# Patient Record
Sex: Female | Born: 1960 | Race: White | Hispanic: No | Marital: Single | Smoking: Never smoker
Health system: Southern US, Community
[De-identification: ages and names within clinical notes are randomized; demographics above are authoritative.]

## PROBLEM LIST (undated history)

## (undated) DIAGNOSIS — N390 Urinary tract infection, site not specified: Secondary | ICD-10-CM

## (undated) DIAGNOSIS — H409 Unspecified glaucoma: Secondary | ICD-10-CM

## (undated) DIAGNOSIS — N200 Calculus of kidney: Secondary | ICD-10-CM

## (undated) DIAGNOSIS — Z967 Presence of other bone and tendon implants: Secondary | ICD-10-CM

## (undated) HISTORY — DX: Unspecified glaucoma: H40.9

## (undated) HISTORY — PX: OTHER SURGICAL HISTORY: SHX169

## (undated) HISTORY — PX: DILATION AND CURETTAGE OF UTERUS: SHX78

## (undated) HISTORY — DX: Calculus of kidney: N20.0

## (undated) HISTORY — PX: WISDOM TOOTH EXTRACTION: SHX21

## (undated) HISTORY — PX: TONSILLECTOMY: SHX5217

## (undated) HISTORY — DX: Urinary tract infection, site not specified: N39.0

## (undated) HISTORY — PX: TONSILLECTOMY: SUR1361

## (undated) HISTORY — DX: Presence of other bone and tendon implants: Z96.7

---

## 2009-10-23 ENCOUNTER — Ambulatory Visit: Payer: Self-pay | Admitting: Family Medicine

## 2010-10-31 ENCOUNTER — Ambulatory Visit: Payer: Self-pay | Admitting: Internal Medicine

## 2012-07-08 ENCOUNTER — Ambulatory Visit: Payer: Self-pay | Admitting: Family Medicine

## 2012-07-08 LAB — CBC WITH DIFFERENTIAL/PLATELET
Basophil #: 0 10*3/uL (ref 0.0–0.1)
Basophil %: 0.4 %
Eosinophil #: 0.1 10*3/uL (ref 0.0–0.7)
Eosinophil %: 2 %
HGB: 13.5 g/dL (ref 12.0–16.0)
Lymphocyte %: 39.1 %
MCHC: 34 g/dL (ref 32.0–36.0)
Monocyte #: 0.5 x10 3/mm (ref 0.2–0.9)
Neutrophil %: 50.8 %
Platelet: 184 10*3/uL (ref 150–440)
RBC: 4.25 10*6/uL (ref 3.80–5.20)
WBC: 6.6 10*3/uL (ref 3.6–11.0)

## 2012-07-08 LAB — COMPREHENSIVE METABOLIC PANEL
Albumin: 3.8 g/dL (ref 3.4–5.0)
Anion Gap: 10 (ref 7–16)
BUN: 11 mg/dL (ref 7–18)
Chloride: 104 mmol/L (ref 98–107)
Co2: 24 mmol/L (ref 21–32)
EGFR (African American): >60
EGFR (Non-African Amer.): >60
Potassium: 3.9 mmol/L (ref 3.5–5.1)
SGOT(AST): 15 U/L (ref 15–37)
SGPT (ALT): 21 U/L (ref 12–78)
Total Protein: 7.5 g/dL (ref 6.4–8.2)

## 2012-12-25 ENCOUNTER — Ambulatory Visit: Payer: Self-pay | Admitting: Internal Medicine

## 2013-06-28 ENCOUNTER — Ambulatory Visit: Payer: Self-pay | Admitting: Internal Medicine

## 2013-07-12 ENCOUNTER — Ambulatory Visit: Payer: Self-pay | Admitting: Gastroenterology

## 2014-12-01 ENCOUNTER — Ambulatory Visit: Payer: Self-pay | Admitting: Physician Assistant

## 2015-01-21 ENCOUNTER — Ambulatory Visit: Payer: Self-pay | Admitting: Internal Medicine

## 2015-06-03 ENCOUNTER — Ambulatory Visit
Admission: EM | Admit: 2015-06-03 | Discharge: 2015-06-03 | Disposition: A | Discharge status: Home / Self Care | Payer: BLUE CROSS/BLUE SHIELD | Attending: Family Medicine | Admitting: Family Medicine

## 2015-06-03 DIAGNOSIS — L299 Pruritus, unspecified: Secondary | ICD-10-CM

## 2015-06-03 DIAGNOSIS — B369 Superficial mycosis, unspecified: Secondary | ICD-10-CM | POA: Insufficient documentation

## 2015-06-03 DIAGNOSIS — R21 Rash and other nonspecific skin eruption: Secondary | ICD-10-CM | POA: Diagnosis present

## 2015-06-03 LAB — GLUCOSE, CAPILLARY: Glucose-Capillary: 92 mg/dL (ref 65–99)

## 2015-06-03 MED ORDER — RANITIDINE HCL 150 MG PO CAPS: 150.0000 mg | ORAL_CAPSULE | Freq: Two times a day (BID) | ORAL | Status: DC

## 2015-06-03 MED ORDER — KETOCONAZOLE 2 % EX CREA: 1.0000 "application " | TOPICAL_CREAM | Freq: Two times a day (BID) | CUTANEOUS | Status: DC

## 2015-06-03 MED ORDER — LORATADINE 10 MG PO TABS: 10.0000 mg | ORAL_TABLET | Freq: Every day | ORAL | Status: DC

## 2015-06-03 NOTE — ED Provider Notes (Signed)
CSN: [id number]     Arrival date & time 06/03/15  1516 History   First MD Initiated Contact with Patient 06/03/15 1609     Chief Complaint  Patient presents with  . Rash   (Consider location/radiation/quality/duration/timing/severity/associated sxs/prior Treatment) Patient is a 54 y.o. female presenting with rash. No language interpreter was used.  Rash Location: R breast and , some chest, and neck. Quality: itchiness   Quality: not blistering, not bruising, not dry and not peeling   Severity:  Moderate Onset quality:  Sudden Duration:  5 days (patient is unable to say exactly when ) Progression:  Spreading Chronicity:  New Context: eggs   Context: not animal contact, not chemical exposure and not diapers   Relieved by:  Nothing Worsened by:  Nothing tried Ineffective treatments:  Antihistamines Associated symptoms: no abdominal pain, no diarrhea, no fatigue and no fever     History reviewed. No pertinent past medical history. Past Surgical History  Procedure Laterality Date  . Tonsillectomy     History reviewed. No pertinent family history. History  Substance Use Topics  . Smoking status: Never Smoker   . Smokeless tobacco: Not on file  . Alcohol Use: 0.0 oz/week    0 Standard drinks or equivalent per week     Comment: rarely   OB History    No data available     Review of Systems  Constitutional: Negative for fever and fatigue.  Gastrointestinal: Negative for abdominal pain and diarrhea.  Skin: Positive for rash.    Allergies  Penicillins  Home Medications   Prior to Admission medications   Medication Sig Start Date End Date Taking? Authorizing Provider  Biotin 5 MG TABS Take by mouth.   Yes Historical Provider, MD  calcium carbonate (OS-CAL) 600 MG TABS tablet Take 600 mg by mouth 2 (two) times daily with a meal.   Yes Historical Provider, MD  Multiple Vitamin (MULTIVITAMIN) tablet Take 1 tablet by mouth daily.   Yes Historical Provider, MD  vitamin B-12  (CYANOCOBALAMIN) 1000 MCG tablet Take 1,000 mcg by mouth daily.   Yes Historical Provider, MD  ketoconazole (NIZORAL) 2 % cream Apply 1 application topically 2 (two) times daily. 06/03/15   Hassan Rowan, MD  loratadine (CLARITIN) 10 MG tablet Take 1 tablet (10 mg total) by mouth daily. Take 1 tablet in the morning. As needed for itching. 06/03/15   Hassan Rowan, MD  ranitidine (ZANTAC) 150 MG capsule Take 1 capsule (150 mg total) by mouth 2 (two) times daily. 06/03/15   Hassan Rowan, MD   BP 139/100 mmHg  Pulse 69  Temp(Src) 98 F (36.7 C) (Oral)  Resp 16  Ht 5\' 5"  (1.651 m)  Wt 187 lb (84.823 kg)  BMI 31.12 kg/m2  SpO2 100%  LMP 05/20/2015 (Approximate) Physical Exam  Constitutional: She is oriented to person, place, and time. She appears well-developed and well-nourished. No distress.  HENT:  Head: Normocephalic and atraumatic.  Neck: Neck supple.  Neurological: She is alert and oriented to person, place, and time.  Skin: Rash noted. There is cyanosis and erythema.     Psychiatric: She has a normal mood and affect.  Vitals reviewed.  Rash heaviest under the L breast ED Course  Procedures (including critical care time) Labs Review Labs Reviewed  GLUCOSE, CAPILLARY    Imaging Review No results found. Results for orders placed or performed during the hospital encounter of 06/03/15  Glucose, capillary  Result Value Ref Range   Glucose-Capillary 92 65 -  99 mg/dL    Patient rash consistent w/fungal rash. BS checked to make sure diabetes was not being missed.  Will treat w/antifungal cream and antihistamines.  MDM   1. Fungal dermatitis   2. Rash, skin   3. Itching       Hassan Rowan, MD 06/04/15 [phone]

## 2015-06-03 NOTE — ED Notes (Signed)
Patient states that she has a rash under left breast. She states that her entire chest is itchy. She states that symptoms started 1 week ago, worsening over last 3 days. She states that she has tried benadryl and witch hazel.

## 2015-06-03 NOTE — Discharge Instructions (Signed)
Cutaneous Candidiasis °Cutaneous candidiasis is a condition in which there is an overgrowth of yeast (candida) on the skin. Yeast normally live on the skin, but in small enough numbers not to cause any symptoms. In certain cases, increased growth of the yeast may cause an actual yeast infection. This kind of infection usually occurs in areas of the skin that are constantly warm and moist, such as the armpits or the groin. Yeast is the most common cause of diaper rash in babies and in people who cannot control their bowel movements (incontinence). °CAUSES  °The fungus that most often causes cutaneous candidiasis is Candida albicans. Conditions that can increase the risk of getting a yeast infection of the skin include: °· Obesity. °· Pregnancy. °· Diabetes. °· Taking antibiotic medicine. °· Taking birth control pills. °· Taking steroid medicines. °· Thyroid disease. °· An iron or zinc deficiency. °· Problems with the immune system. °SYMPTOMS  °· Red, swollen area of the skin. °· Bumps on the skin. °· Itchiness. °DIAGNOSIS  °The diagnosis of cutaneous candidiasis is usually based on its appearance. Light scrapings of the skin may also be taken and viewed under a microscope to identify the presence of yeast. °TREATMENT  °Antifungal creams may be applied to the infected skin. In severe cases, oral medicines may be needed.  °HOME CARE INSTRUCTIONS  °· Keep your skin clean and dry. °· Maintain a healthy weight. °· If you have diabetes, keep your blood sugar under control. °SEEK IMMEDIATE MEDICAL CARE IF: °· Your rash continues to spread despite treatment. °· You have a fever, chills, or abdominal pain. °Document Released: 07/05/2011 Document Revised: 01/09/2012 Document Reviewed: 07/05/2011 °[employer]® Patient Information ©2015 [employer]. This information is not intended to replace advice given to you by your health care provider. Make sure you discuss any questions you have with your health care provider. ° °Rash °A  rash is a change in the color or feel of your skin. There are many different types of rashes. You may have other problems along with your rash. °HOME CARE °· Avoid the thing that caused your rash. °· Do not scratch your rash. °· You may take cools baths to help stop itching. °· Only take medicines as told by your doctor. °· Keep all doctor visits as told. °GET HELP RIGHT AWAY IF:  °· Your pain, puffiness (swelling), or redness gets worse. °· You have a fever. °· You have new or severe problems. °· You have body aches, watery poop (diarrhea), or you throw up (vomit). °· Your rash is not better after 3 days. °MAKE SURE YOU:  °· Understand these instructions. °· Will watch your condition. °· Will get help right away if you are not doing well or get worse. °Document Released: 04/04/2008 Document Revised: 01/09/2012 Document Reviewed: 08/01/2011 °[employer]® Patient Information ©2015 [employer]. This information is not intended to replace advice given to you by your health care provider. Make sure you discuss any questions you have with your health care provider. ° °

## 2015-09-10 ENCOUNTER — Other Ambulatory Visit: Payer: Self-pay | Admitting: Internal Medicine

## 2015-09-10 DIAGNOSIS — R1030 Lower abdominal pain, unspecified: Secondary | ICD-10-CM

## 2015-09-10 DIAGNOSIS — N898 Other specified noninflammatory disorders of vagina: Secondary | ICD-10-CM

## 2015-09-10 DIAGNOSIS — R140 Abdominal distension (gaseous): Secondary | ICD-10-CM

## 2015-09-14 ENCOUNTER — Ambulatory Visit
Admission: RE | Admit: 2015-09-14 | Discharge: 2015-09-14 | Disposition: A | Discharge status: Home / Self Care | Payer: BLUE CROSS/BLUE SHIELD | Source: Ambulatory Visit | Attending: Internal Medicine | Admitting: Internal Medicine

## 2015-09-14 DIAGNOSIS — R1030 Lower abdominal pain, unspecified: Secondary | ICD-10-CM | POA: Diagnosis present

## 2015-09-14 DIAGNOSIS — N898 Other specified noninflammatory disorders of vagina: Secondary | ICD-10-CM | POA: Insufficient documentation

## 2015-09-14 DIAGNOSIS — N83201 Unspecified ovarian cyst, right side: Secondary | ICD-10-CM | POA: Diagnosis not present

## 2015-09-14 DIAGNOSIS — R140 Abdominal distension (gaseous): Secondary | ICD-10-CM

## 2015-10-06 ENCOUNTER — Encounter: Payer: Self-pay | Admitting: Certified Nurse Midwife

## 2016-06-29 IMAGING — US US TRANSVAGINAL NON-OB
1 series · 13 of 25 positions shown · non-contrast
Comparison: Pelvic ultrasound December 25, 2012 and abdominal
and pelvic CT scan June 28, 2013.

CLINICAL DATA: Vaginal discharge, abdominal and pelvic abdominal
pain, symptoms since December 2014. The patient's last normal
menstrual period began today.

EXAM:
TRANSABDOMINAL AND TRANSVAGINAL ULTRASOUND OF PELVIS
TECHNIQUE: Both transabdominal and transvaginal ultrasound examinations of the
pelvis were performed. Transabdominal technique was performed for
global imaging of the pelvis including uterus, ovaries, adnexal
regions, and pelvic cul-de-sac. It was necessary to proceed with
endovaginal exam following the transabdominal exam to visualize the
endometrium and ovaries.

[Series 1: us transvaginal non-ob · 0.24mm/px · 13 of 86 slices shown]
[im 1/86]
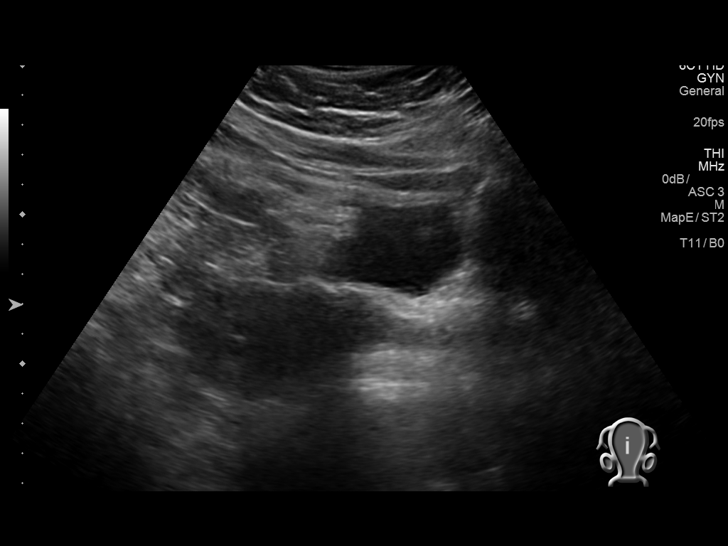
[im 8/86]
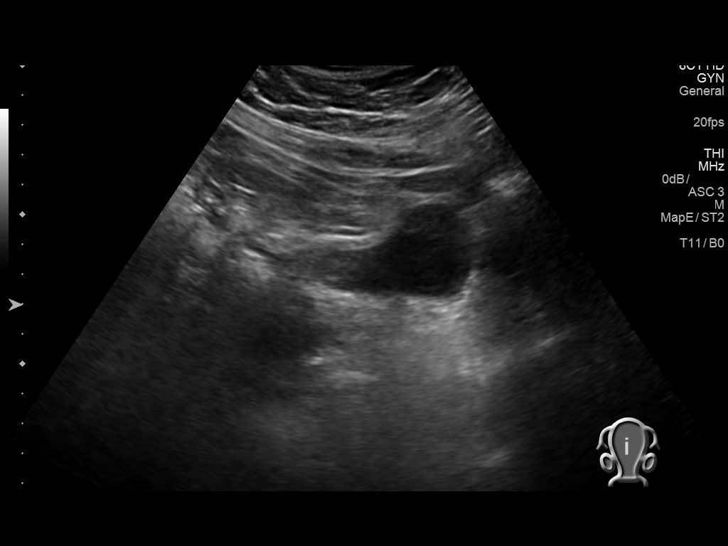
[im 15/86]
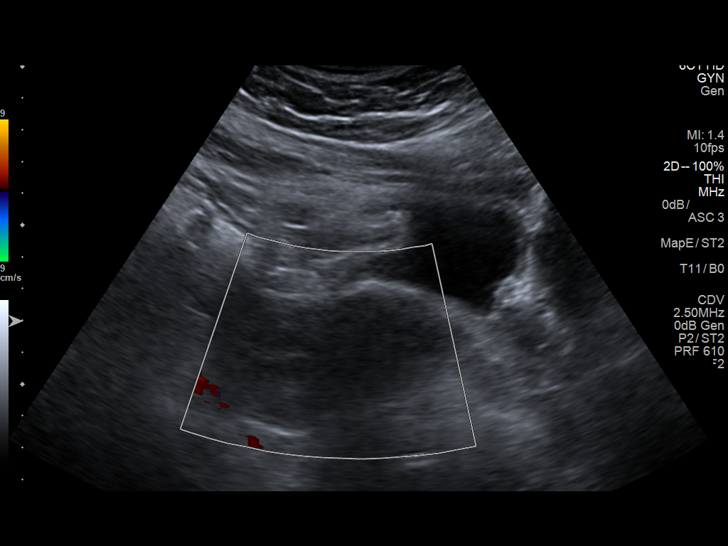
[im 22/86]
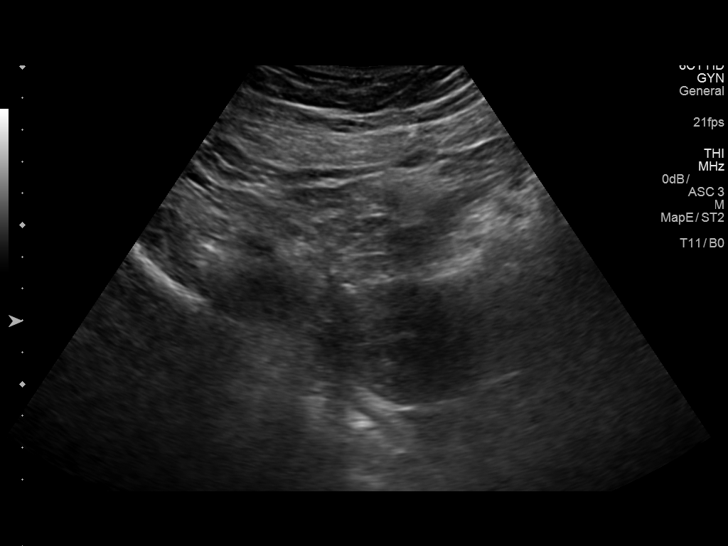
[im 29/86]
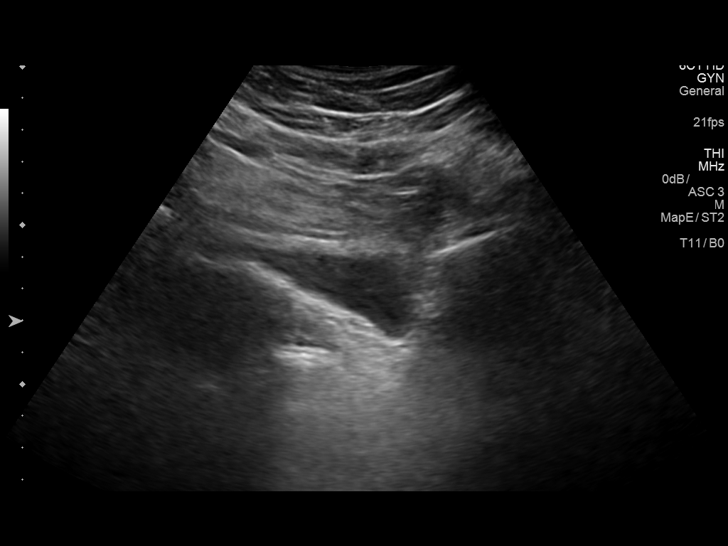
[im 36/86]
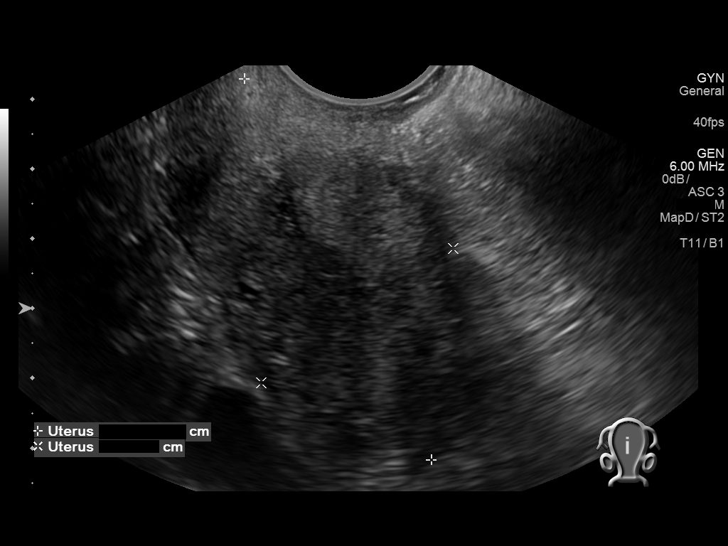
[im 43/86]
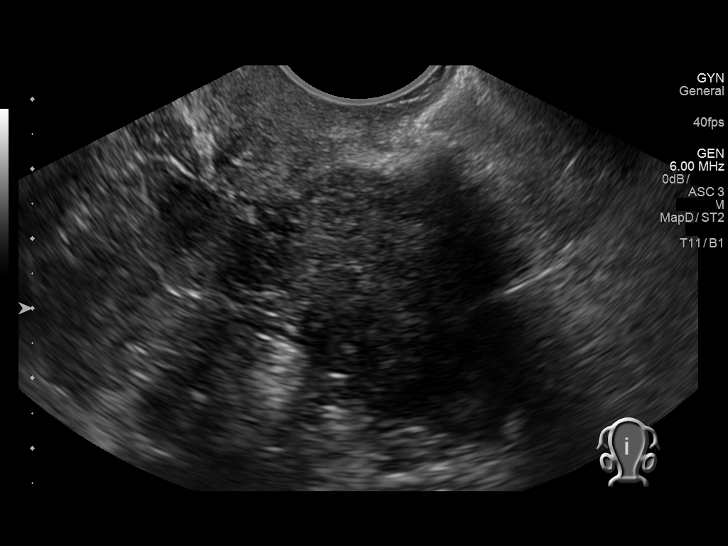
[im 50/86]
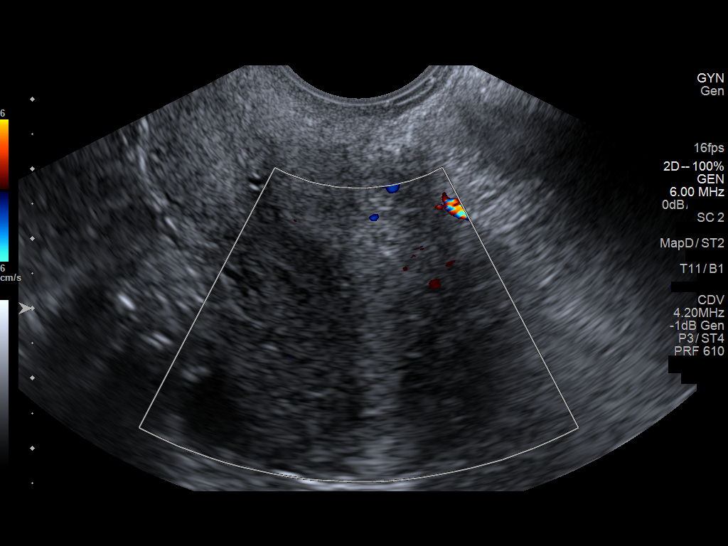
[im 57/86]
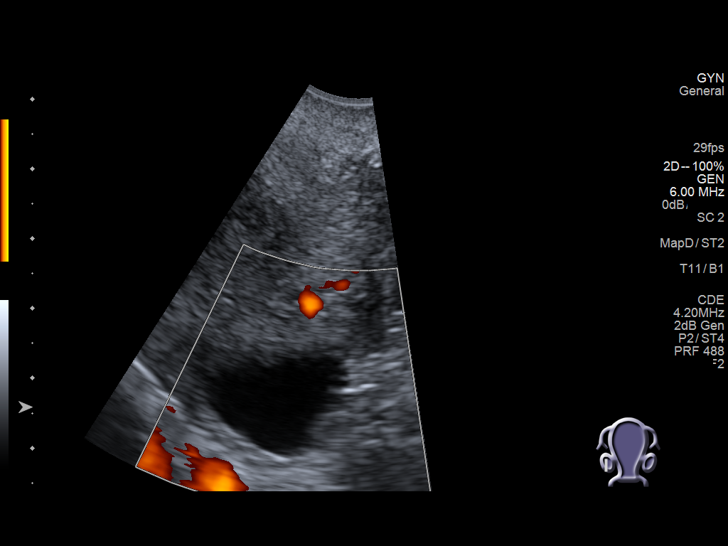
[im 64/86]
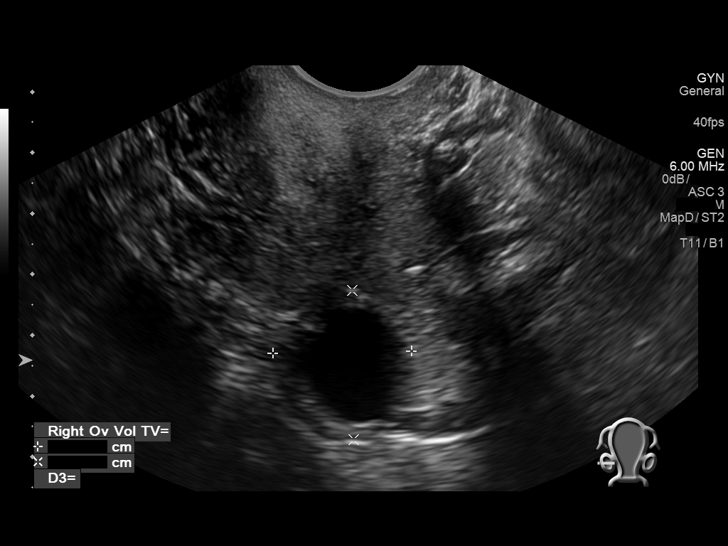
[im 71/86]
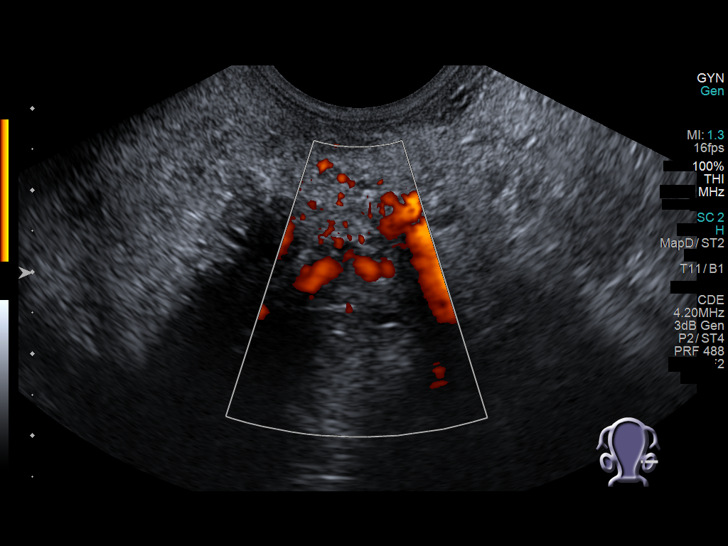
[im 78/86]
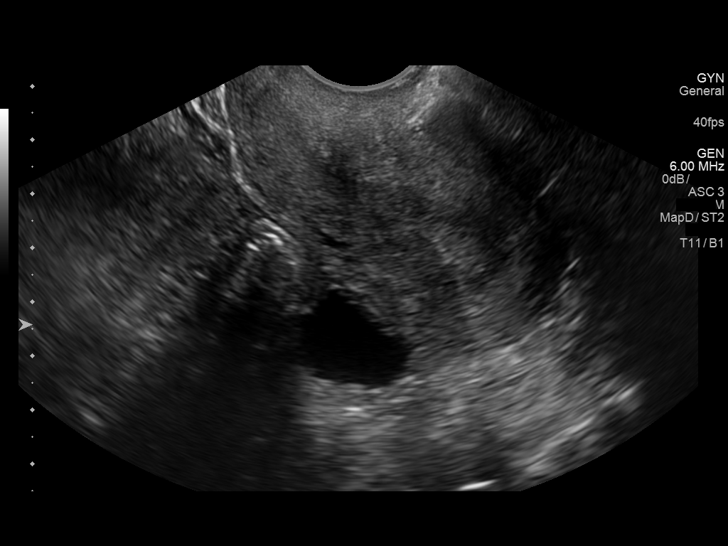
[im 86/86]
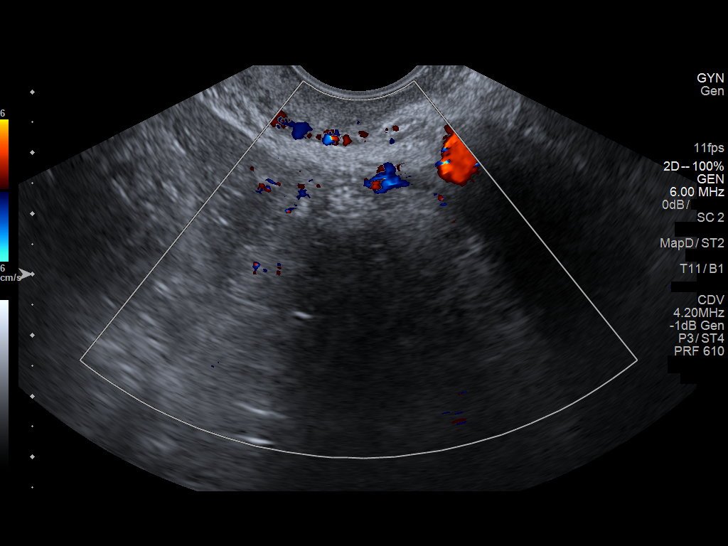

[13 of 25 positions shown; findings below may reference images not displayed]

FINDINGS: Uterus

Measurements: 7.1 x 3.8 x 5.9 cm.. The uterus is retroflexed. The
myometrial echotexture is normal.

Endometrium

Thickness: 5.9 cm.  No focal abnormality visualized.

Right ovary

Measurements: 2.5 x 2.3 x 2.5 cm. There is a simple appearing cystic
structure measuring 2.1 x 1.8 x 1.9 cm.

Left ovary

Measurements: 1.3 x 0.8 x 0.9 cm. Normal appearance/no adnexal mass.

Other findings

There is no free pelvic fluid.
IMPRESSION: 1. There is no evidence of pelvic masses or free fluid or other
evidence of pelvic inflammatory disease.
2. The uterus and ovaries exhibit no acute abnormalities. A simple
appearing right ovarian cyst measures 2.1 cm in greatest dimension.

## 2016-07-21 ENCOUNTER — Other Ambulatory Visit: Payer: Self-pay | Admitting: Internal Medicine

## 2016-07-21 DIAGNOSIS — M79605 Pain in left leg: Secondary | ICD-10-CM

## 2016-07-26 ENCOUNTER — Ambulatory Visit: Admission: RE | Admit: 2016-07-26 | Payer: BLUE CROSS/BLUE SHIELD | Source: Ambulatory Visit

## 2016-09-13 IMAGING — CR RIGHT ELBOW - COMPLETE 3+ VIEW
7 series · 7 of 7 positions shown · non-contrast
Comparison: None.

CLINICAL DATA: Initial encounter for pain after fall.

EXAM:
RIGHT ELBOW - COMPLETE 3+ VIEW

[elbow lat]
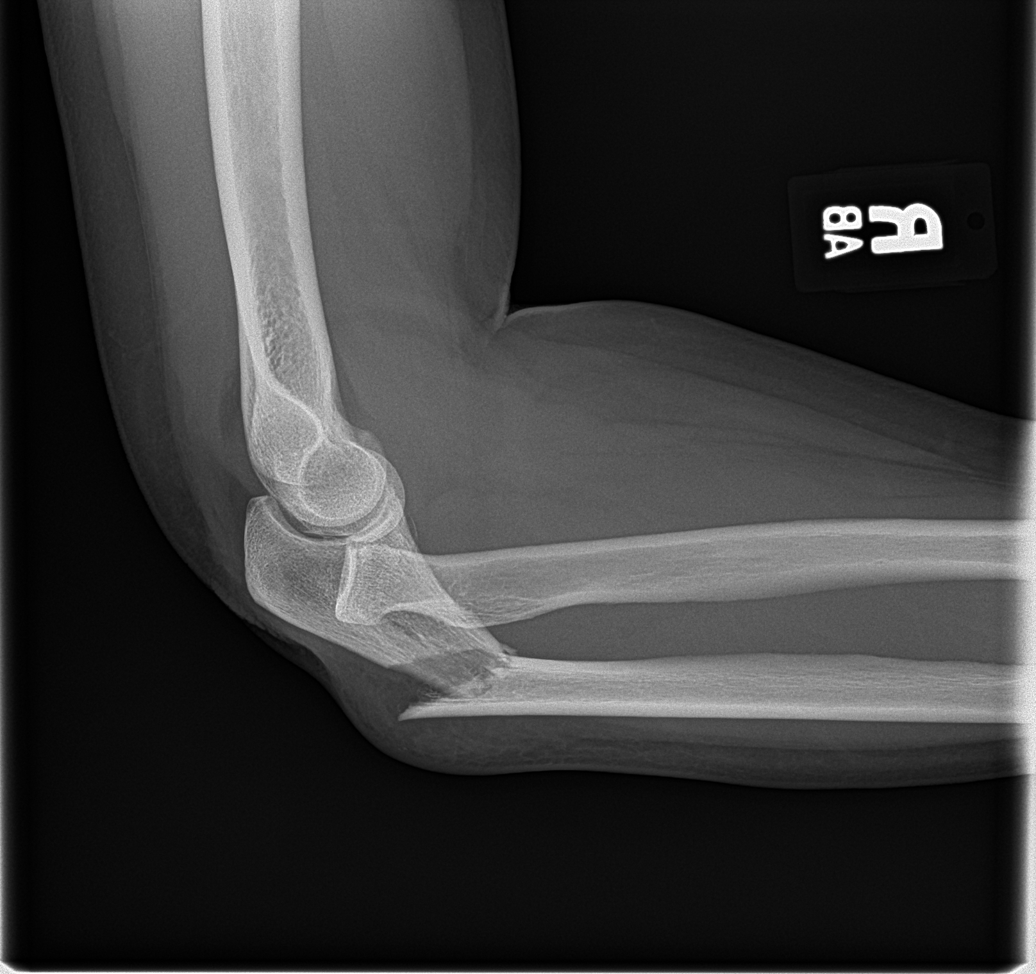

[elbow ap (1 of 3)]
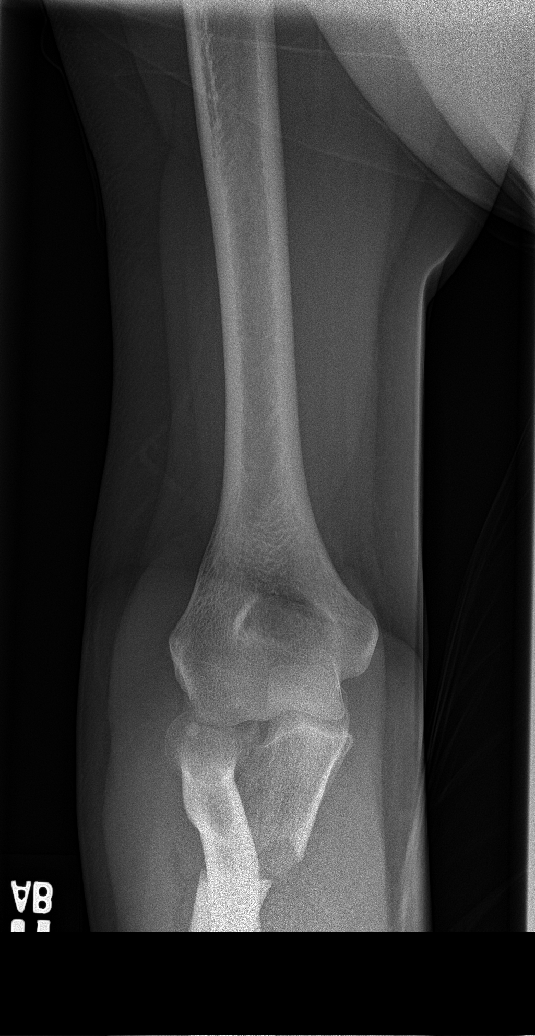

[elbow ap (2 of 3)]
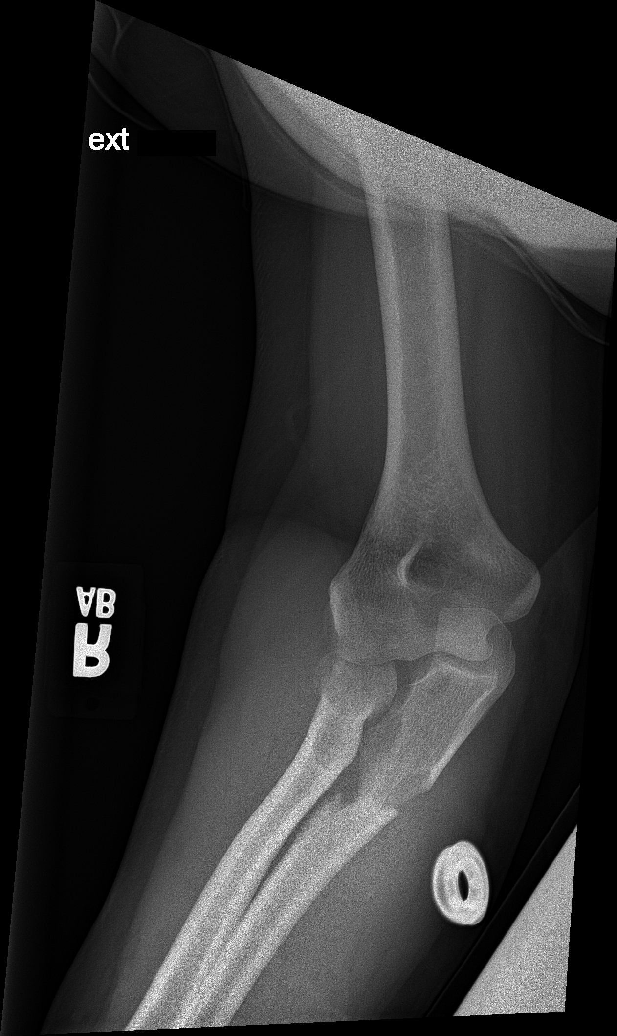

[elbow obl (1 of 3)]
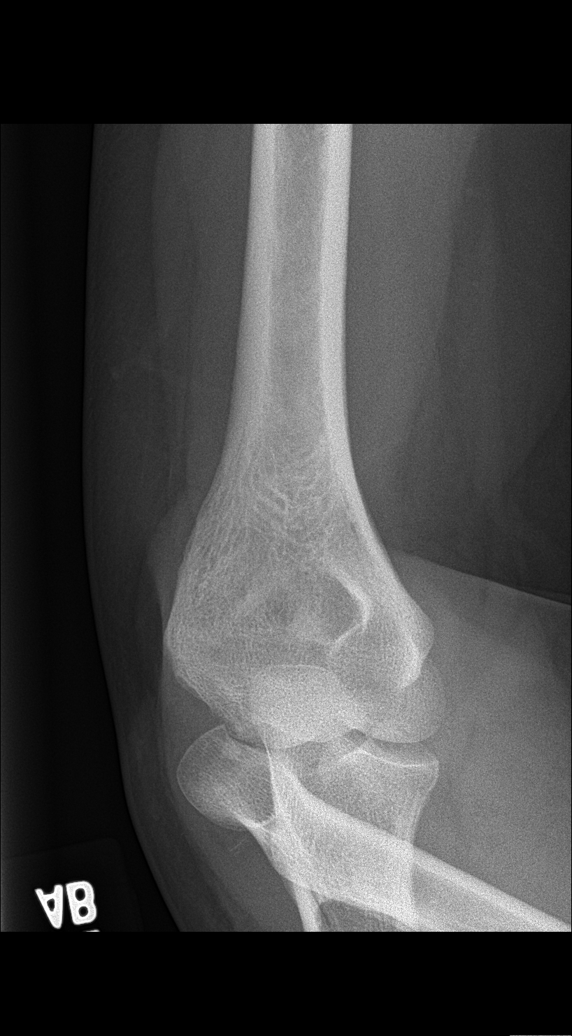

[elbow obl (2 of 3)]
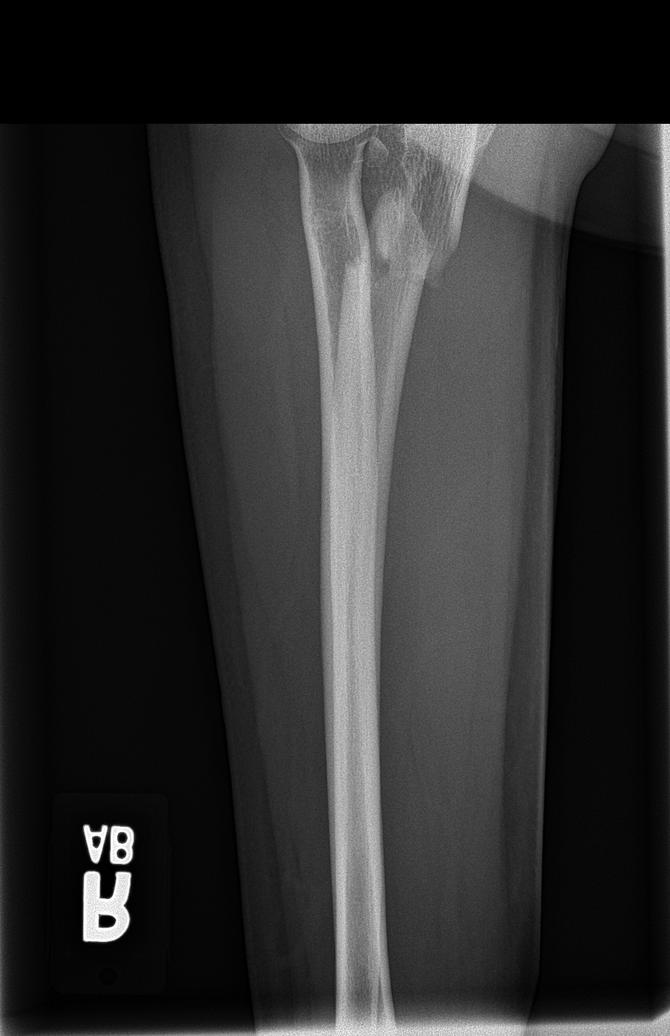

[elbow obl (3 of 3)]
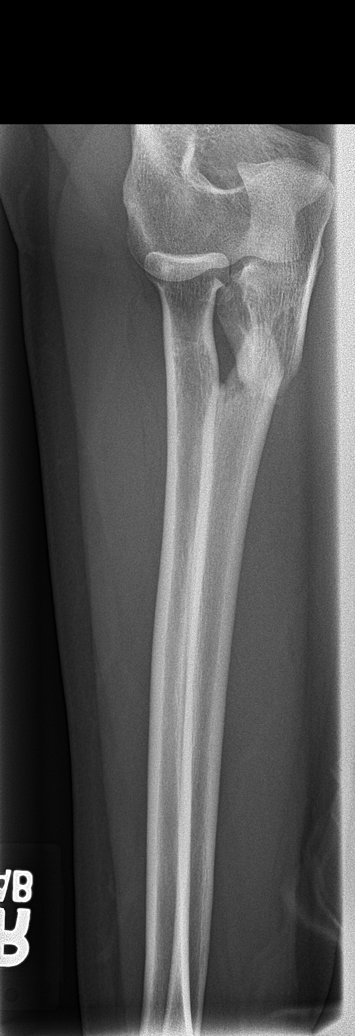

[elbow ap (3 of 3)]
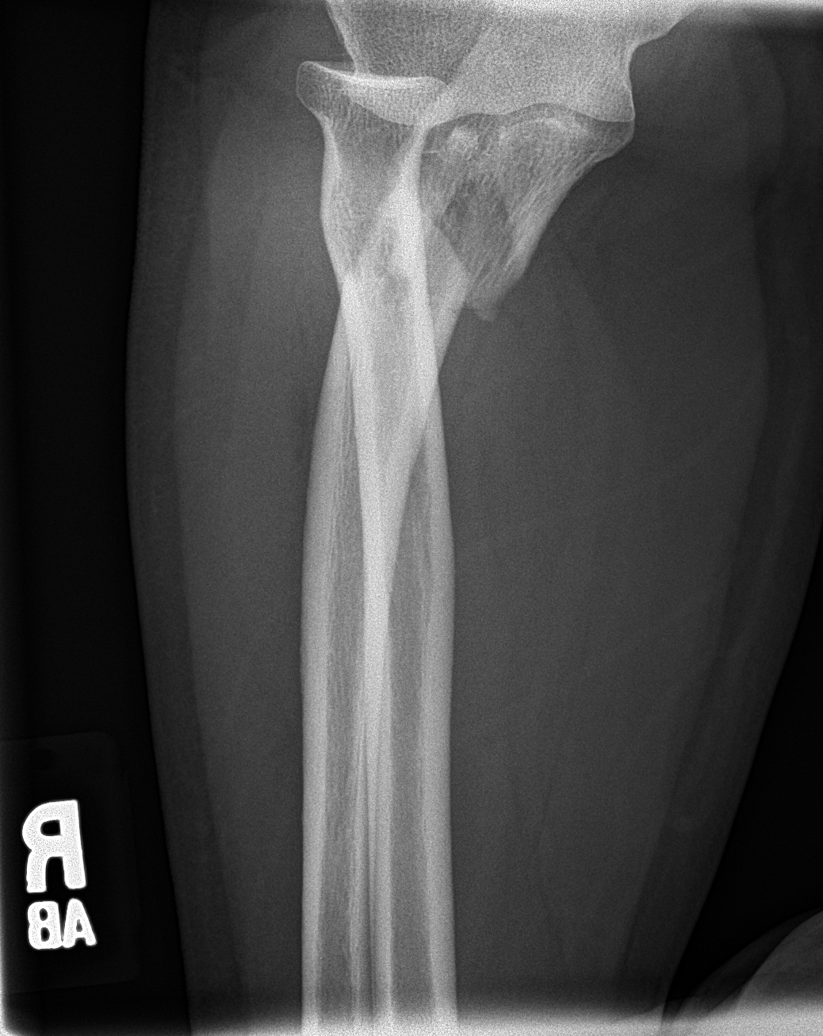

[7 of 7 positions shown; findings below may reference images not displayed]

FINDINGS: Suboptimal patient positioning secondary to clinical status.
Oblique, angulated fracture of the proximal humerus. Soft tissue
swelling. Radial head projects posterior to the capitellum,
presumably representing dislocation. Elbow joint effusion, as
evidenced by a posterior fat pad.
IMPRESSION: Proximal ulna fracture with radial head dislocation. Suboptimal
patient positioning on all views secondary to clinical status.
Consider repeat radiographs or CT after pain control.

Elbow joint effusion.

## 2017-03-13 ENCOUNTER — Ambulatory Visit (INDEPENDENT_AMBULATORY_CARE_PROVIDER_SITE_OTHER): Payer: BLUE CROSS/BLUE SHIELD | Admitting: Obstetrics and Gynecology

## 2017-03-13 ENCOUNTER — Encounter: Payer: Self-pay | Admitting: Obstetrics and Gynecology

## 2017-03-13 VITALS — BP 140/84 | HR 66 | Ht 65.0 in | Wt 199.0 lb

## 2017-03-13 DIAGNOSIS — N649 Disorder of breast, unspecified: Secondary | ICD-10-CM

## 2017-03-13 DIAGNOSIS — R630 Anorexia: Secondary | ICD-10-CM

## 2017-03-13 DIAGNOSIS — R6889 Other general symptoms and signs: Secondary | ICD-10-CM

## 2017-03-13 DIAGNOSIS — N898 Other specified noninflammatory disorders of vagina: Secondary | ICD-10-CM | POA: Diagnosis not present

## 2017-03-13 DIAGNOSIS — R102 Pelvic and perineal pain: Secondary | ICD-10-CM

## 2017-03-13 DIAGNOSIS — N6459 Other signs and symptoms in breast: Secondary | ICD-10-CM

## 2017-03-13 LAB — POCT URINALYSIS DIPSTICK
Bilirubin, UA: NEGATIVE
Blood, UA: NEGATIVE
Glucose, UA: NEGATIVE
KETONES UA: NEGATIVE
LEUKOCYTES UA: NEGATIVE
Nitrite, UA: NEGATIVE
PH UA: 5 (ref 5.0–8.0)
Spec Grav, UA: >=1.03 — AB (ref 1.010–1.025)
Urobilinogen, UA: 0.2 E.U./dL

## 2017-03-13 LAB — POCT WET PREP WITH KOH
Clue Cells Wet Prep HPF POC: NEGATIVE
KOH PREP POC: NEGATIVE
Trichomonas, UA: NEGATIVE
Yeast Wet Prep HPF POC: NEGATIVE

## 2017-03-13 NOTE — Progress Notes (Signed)
Chief Complaint  Patient presents with  . Gynecologic Exam    pressure, cramping    HPI:      [name] is a 56 y.o. G1P0010 who LMP was No LMP recorded (lmp unknown)., presents today for NP pelvic pain and pressure for the past few months, sx increased the past few wks. Pelvic pressure, cramping and occas sharp pains are intermittent. No aggrav/allev factors. She also has fullness, so bad at times she can't bend over due to pain. She has been having LBP, and she thought it was kidney stones (hx of them twice in the past). She thinks she has had some fevers but hasn't taken her temp. She has had intermittent nausea and constipation, as well as anorexia. She is unsure if she has lost or gained wt, but isn't able to eat much. She has urinary frequency with and without good flow at times. No dysuria, hematuria. She has had increased d/c and occas irritation, no odor.   She is not sex active. She has irreg menses, never going a year without bleeding. She denies hot flashes/nt sweats. She has noticed enlarging breasts and is wearing an athletic bra 2-3 times her usual size. She had a mammogram recently--awaiting results. Pos FH breast cancer in mat aunt, age 19+. No FH ovar/colon cancer. She had a colonoscopy about 10 yrs ago. She has not had recent labs.   She had a neg pap smear last yr, per pt report. Hx of cryotherapy years ago.   There are no active problems to display for this patient.  Past Medical History:  Diagnosis Date  . Kidney stones   . 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 UTI (urinary tract infection)      Family History  Problem Relation Age of Onset  . Diabetes Mother   . Hypertension Mother   . Arrhythmia Mother   . Kidney disease Mother   . Glaucoma Mother   . Cancer Father 17       lumphoma, lung, throat  . Anemia Sister   . Breast cancer Maternal Aunt        80  . Cancer Maternal Aunt 50       uterine tumor    Social History   Social History   . Marital status: Single    Spouse name: N/A  . Number of children: N/A  . Years of education: N/A   Occupational History  . Not on file.   Social History Main Topics  . Smoking status: Never Smoker  . Smokeless tobacco: Never Used  . Alcohol use 0.0 oz/week     Comment: rarely  . Drug use: No  . Sexual activity: No   Other Topics Concern  . Not on file   Social History Narrative  . No narrative on file     Current Outpatient Prescriptions:  .  acetaminophen (TYLENOL) 325 MG tablet, Take 650 mg by mouth every 6 (six) hours as needed for moderate pain., Disp: , Rfl:  .  Multiple Vitamin (MULTIVITAMIN) tablet, Take 1 tablet by mouth daily., Disp: , Rfl:   Review of Systems  Constitutional: Positive for appetite change, fatigue, fever and unexpected weight change. Negative for activity change.  Respiratory: Positive for shortness of breath. Negative for cough and wheezing.   Cardiovascular: Positive for chest pain.  Gastrointestinal: Positive for abdominal distention, constipation and nausea. Negative for diarrhea and vomiting.  Genitourinary: Positive for decreased urine volume, flank pain,  frequency, pelvic pain and vaginal discharge. Negative for dysuria, hematuria, menstrual problem and vaginal bleeding.  Musculoskeletal: Positive for back pain and myalgias. Negative for gait problem and joint swelling.  Skin: Negative for color change, rash and wound.  Neurological: Positive for dizziness and numbness. Negative for headaches.  Hematological: Negative for adenopathy. Does not bruise/bleed easily.  Psychiatric/Behavioral: Negative for agitation, behavioral problems and dysphoric mood. The patient is not nervous/anxious.      OBJECTIVE:   Vitals:  BP 140/84 (Patient Position: Sitting)   Pulse 66   Ht 5\' 5"  (1.651 m)   Wt 199 lb (90.3 kg)   LMP  (LMP Unknown)   BMI 33.12 kg/m   Physical Exam  Constitutional: She is oriented to person, place, and time and  well-developed, well-nourished, and in no distress. No distress.  HENT:  Head: Normocephalic.  Neck: Normal range of motion.  Pulmonary/Chest: Effort normal. No respiratory distress.  Abdominal: Soft. She exhibits no mass. There is no splenomegaly. There is tenderness. There is guarding and CVA tenderness.  Genitourinary: Vagina normal and cervix normal. Uterus is tender. Cervix exhibits no motion tenderness and no tenderness. Right adnexum displays tenderness. Left adnexum displays tenderness. Vulva exhibits no exudate, no lesion and no tenderness. No vaginal discharge found.  Musculoskeletal: Normal range of motion. She exhibits no edema or deformity.  Neurological: She is alert and oriented to person, place, and time. Coordination normal.  Psychiatric: Memory, affect and judgment normal.  Vitals reviewed.   Results: Results for orders placed or performed in visit on 03/13/17 (from the past 24 hour(s))  POCT Wet Prep with KOH     Status: Normal   Collection Time: 03/13/17  4:29 PM  Result Value Ref Range   Trichomonas, UA Negative    Clue Cells Wet Prep HPF POC neg    Epithelial Wet Prep HPF POC  Few, Moderate, Many, Too numerous to count   Yeast Wet Prep HPF POC neg    Bacteria Wet Prep HPF POC  Few   RBC Wet Prep HPF POC     WBC Wet Prep HPF POC     KOH Prep POC Negative Negative  POCT Urinalysis Dipstick     Status: Abnormal   Collection Time: 03/13/17  4:30 PM  Result Value Ref Range   Color, UA yellow    Clarity, UA     Glucose, UA neg    Bilirubin, UA neg    Ketones, UA neg    Spec Grav, UA >=1.030 (A) 1.010 - 1.025   Blood, UA neg    pH, UA 5.0 5.0 - 8.0   Protein, UA trace    Urobilinogen, UA 0.2 0.2 or 1.0 E.U./dL   Nitrite, UA neg    Leukocytes, UA Negative Negative     Assessment/Plan: Pelvic pain - Check GYN u/s. Neg wet prep/urine dip, check C&S to confirm. Check labs.  - Plan: US Transvaginal Non-OB, Comprehensive metabolic panel, CBC with  Differential/Platelet, TSH + free T4, Urine culture, POCT Urinalysis Dipstick  Vaginal discharge - neg wet prep/exam. Reassurance. - Plan: POCT Wet Prep with KOH  Decreased appetite - Plan: Comprehensive metabolic panel, CBC with Differential/Platelet, TSH + free T4  Recent change in weight - If GYN etiology ruled out, pt needs to f/u with PCP. - Plan: Comprehensive metabolic panel, CBC with Differential/Platelet, TSH + free T4  Breast complaint - Enlarging breasts. Check labs. Pt had recent mammo.    Return in about 1 day (around 03/14/2017) for  GYN u/s for pelvic pain; f/u with [facility] after.  Alicia B. Copland, PA-C 03/13/2017 5:16 PM

## 2017-03-14 ENCOUNTER — Telehealth: Payer: Self-pay | Admitting: Obstetrics and Gynecology

## 2017-03-14 LAB — COMPREHENSIVE METABOLIC PANEL
ALK PHOS: 85 IU/L (ref 39–117)
ALT: 26 IU/L (ref 0–32)
AST: 22 IU/L (ref 0–40)
Albumin/Globulin Ratio: 1.6 (ref 1.2–2.2)
Albumin: 4.4 g/dL (ref 3.5–5.5)
BUN/Creatinine Ratio: 19 (ref 9–23)
BUN: 13 mg/dL (ref 6–24)
Bilirubin Total: 0.8 mg/dL (ref 0.0–1.2)
CO2: 23 mmol/L (ref 18–29)
CREATININE: 0.67 mg/dL (ref 0.57–1.00)
Calcium: 9.5 mg/dL (ref 8.7–10.2)
Chloride: 102 mmol/L (ref 96–106)
GFR calc Af Amer: 114 mL/min/{1.73_m2} (ref >59)
GFR calc non Af Amer: 99 mL/min/{1.73_m2} (ref >59)
GLOBULIN, TOTAL: 2.7 g/dL (ref 1.5–4.5)
Glucose: 92 mg/dL (ref 65–99)
Potassium: 4.1 mmol/L (ref 3.5–5.2)
Sodium: 143 mmol/L (ref 134–144)
Total Protein: 7.1 g/dL (ref 6.0–8.5)

## 2017-03-14 LAB — CBC WITH DIFFERENTIAL/PLATELET
Basophils Absolute: 0 10*3/uL (ref 0.0–0.2)
Basos: 0 %
EOS (ABSOLUTE): 0.1 10*3/uL (ref 0.0–0.4)
EOS: 2 %
HEMATOCRIT: 40.3 % (ref 34.0–46.6)
Hemoglobin: 13.6 g/dL (ref 11.1–15.9)
Immature Grans (Abs): 0 10*3/uL (ref 0.0–0.1)
Immature Granulocytes: 0 %
Lymphocytes Absolute: 2.5 10*3/uL (ref 0.7–3.1)
Lymphs: 33 %
MCH: 30.4 pg (ref 26.6–33.0)
MCHC: 33.7 g/dL (ref 31.5–35.7)
MCV: 90 fL (ref 79–97)
MONOS ABS: 0.6 10*3/uL (ref 0.1–0.9)
Monocytes: 8 %
NEUTROS PCT: 57 %
Neutrophils Absolute: 4.3 10*3/uL (ref 1.4–7.0)
Platelets: 221 10*3/uL (ref 150–379)
RBC: 4.48 x10E6/uL (ref 3.77–5.28)
RDW: 13.6 % (ref 12.3–15.4)
WBC: 7.6 10*3/uL (ref 3.4–10.8)

## 2017-03-14 LAB — TSH+FREE T4
FREE T4: 1.04 ng/dL (ref 0.82–1.77)
TSH: 1.91 u[IU]/mL (ref 0.450–4.500)

## 2017-03-14 NOTE — Telephone Encounter (Signed)
Pt aware of neg labs. Pt will call back to sched u/s based on her schedule.

## 2017-03-15 LAB — URINE CULTURE

## 2017-09-08 ENCOUNTER — Other Ambulatory Visit: Payer: Self-pay | Admitting: Internal Medicine

## 2017-09-08 DIAGNOSIS — R079 Chest pain, unspecified: Secondary | ICD-10-CM

## 2018-04-12 ENCOUNTER — Other Ambulatory Visit
Admission: RE | Admit: 2018-04-12 | Discharge: 2018-04-12 | Disposition: A | Discharge status: Home / Self Care | Payer: BLUE CROSS/BLUE SHIELD | Source: Other Acute Inpatient Hospital | Attending: Internal Medicine | Admitting: Internal Medicine

## 2018-04-12 DIAGNOSIS — R0789 Other chest pain: Secondary | ICD-10-CM | POA: Diagnosis present

## 2018-04-12 LAB — TROPONIN I: Troponin I: <0.03 ng/mL (ref <0.03)

## 2018-12-30 ENCOUNTER — Other Ambulatory Visit: Payer: Self-pay

## 2018-12-30 ENCOUNTER — Emergency Department
Admission: EM | Admit: 2018-12-30 | Discharge: 2018-12-30 | Disposition: A | Discharge status: Home / Self Care | Payer: BLUE CROSS/BLUE SHIELD | Attending: Emergency Medicine | Admitting: Emergency Medicine

## 2018-12-30 ENCOUNTER — Emergency Department: Payer: BLUE CROSS/BLUE SHIELD

## 2018-12-30 DIAGNOSIS — M545 Low back pain, unspecified: Secondary | ICD-10-CM

## 2018-12-30 DIAGNOSIS — Z79899 Other long term (current) drug therapy: Secondary | ICD-10-CM | POA: Diagnosis not present

## 2018-12-30 LAB — URINALYSIS, COMPLETE (UACMP) WITH MICROSCOPIC
Bilirubin Urine: NEGATIVE
Glucose, UA: NEGATIVE mg/dL
Hgb urine dipstick: NEGATIVE
Ketones, ur: NEGATIVE mg/dL
Leukocytes,Ua: NEGATIVE
Nitrite: NEGATIVE
PROTEIN: NEGATIVE mg/dL
Specific Gravity, Urine: 1.016 (ref 1.005–1.030)
pH: 5 (ref 5.0–8.0)

## 2018-12-30 MED ORDER — PREDNISONE 10 MG PO TABS: ORAL_TABLET | ORAL | 0 refills | Status: DC

## 2018-12-30 NOTE — Discharge Instructions (Signed)
Follow-up with your primary care provider if any continued problems.  Begin taking prednisone as directed tapering down 1 tablet each day until completely finished over the next 6 days.  You may use ice or heat to your back as needed for discomfort.  You may also take Tylenol with this medication if needed for additional pain control.

## 2018-12-30 NOTE — ED Triage Notes (Signed)
Pt states she fell several weeks ago and since has been having right lower back pain intermittently.

## 2018-12-30 NOTE — ED Provider Notes (Signed)
[facility] Emergency Department Provider Note  ____________________________________________   First MD Initiated Contact with Patient 12/30/18 1242     (approximate)  I have reviewed the triage vital signs and the nursing notes.   HISTORY  Chief Complaint Back Pain   HPI Olivia Bass is a 58 y.o. female presents to the ED with complaint of low back pain that started several weeks ago.  Patient states that she fell backwards when she was going to sit on her aunts rolling walker.  It was only then that she realized that the brakes on the walker were broken and this caused her to continue going backwards.  She denies any head injury or loss of consciousness.  She does take an occasional over-the-counter medication without any relief of her back pain.  She denies any paresthesias or incontinence of bowel or bladder.  Patient has continued to ambulate without any assistance.  Currently she rates her pain as 4 out of 10.      Past Medical History:  Diagnosis Date  . Kidney stones   . 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 UTI (urinary tract infection)     There are no active problems to display for this patient.   Past Surgical History:  Procedure Laterality Date  . DILATION AND CURETTAGE OF UTERUS     age 68 - miscarriage  . metal plate in right arm    . TONSILLECTOMY    . TONSILLECTOMY    . WISDOM TOOTH EXTRACTION     age 89    Prior to Admission medications   Medication Sig Start Date End Date Taking? Authorizing Provider  acetaminophen (TYLENOL) 325 MG tablet Take 650 mg by mouth every 6 (six) hours as needed for moderate pain.    [provider]  Multiple Vitamin (MULTIVITAMIN) tablet Take 1 tablet by mouth daily.    [provider]  predniSONE (DELTASONE) 10 MG tablet Take 6 tablets  today, on day 2 take 5 tablets, day 3 take 4 tablets, day 4 take 3 tablets, day 5 take  2 tablets and 1 tablet the last day 12/30/18   Tommi Rumps, PA-C    Allergies Other; Penicillins; and Sulfur  Family History  Problem Relation Age of Onset  . Diabetes Mother   . Hypertension Mother   . Arrhythmia Mother   . Kidney disease Mother   . Glaucoma Mother   . Cancer Father 15       lumphoma, lung, throat  . Anemia Sister   . Breast cancer Maternal Aunt        80  . Cancer Maternal Aunt 50       uterine tumor    Social History Social History   Tobacco Use  . Smoking status: Never Smoker  . Smokeless tobacco: Never Used  Substance Use Topics  . Alcohol use: Yes    Alcohol/week: 0.0 standard drinks    Comment: rarely  . Drug use: No    Review of Systems Constitutional: No fever/chills Cardiovascular: Denies chest pain. Gastrointestinal:  No nausea, no vomiting.  Genitourinary: Negative for dysuria. Musculoskeletal: Positive for back pain. Skin: Negative for rash. Neurological: Negative for headaches, focal weakness or numbness. ____________________________________________   PHYSICAL EXAM:  VITAL SIGNS: ED Triage Vitals  Enc Vitals Group     BP 12/30/18 1234 (!) 151/76     Pulse Rate 12/30/18 1234 67     Resp 12/30/18 1234 17  Temp 12/30/18 1234 98.1 F (36.7 C)     Temp Source 12/30/18 1234 Oral     SpO2 12/30/18 1234 97 %     Weight 12/30/18 1235 190 lb (86.2 kg)     Height 12/30/18 1235  (1.651 m)     Head Circumference --      Peak Flow --      Pain Score 12/30/18 1234 4     Pain Loc --      Pain Edu? --      Excl. in GC? --    Constitutional: Alert and oriented. Well appearing and in no acute distress. Eyes: Conjunctivae are normal.  Head: Atraumatic. Nose: No congestion/rhinnorhea. Neck: No stridor.   Cardiovascular: Normal rate, regular rhythm. Grossly normal heart sounds.  Good peripheral circulation. Respiratory: Normal respiratory effort.  No retractions. Lungs CTAB. Musculoskeletal: Examination of the back there is generalized tenderness on palpation of the  paraspinous muscles bilateral lumbar spine.  There is no step-offs appreciated on palpation.  Range of motion is without restriction or muscle spasm seen.  Straight leg raises are negative.  Good muscle strength bilaterally.  Patient continues to ambulate without any assistance. Neurologic:  Normal speech and language. No gross focal neurologic deficits are appreciated.  Reflexes 1+ bilaterally.  No gait instability. Skin:  Skin is warm, dry and intact.  No abrasions or skin discoloration is noted. Psychiatric: Mood and affect are normal. Speech and behavior are normal.  ____________________________________________   LABS (all labs ordered are listed, but only abnormal results are displayed)  Labs Reviewed  URINALYSIS, COMPLETE (UACMP) WITH MICROSCOPIC - Abnormal; Notable for the following components:      Result Value   Color, Urine YELLOW (*)    APPearance HAZY (*)    Bacteria, UA RARE (*)    All other components within normal limits     RADIOLOGY  Official radiology report(s): Dg Lumbar Spine 2-3 Views  Result Date: 12/30/2018 CLINICAL DATA:  Acute low back pain following fall several weeks ago. Initial encounter. EXAM: LUMBAR SPINE - 2-3 VIEW COMPARISON:  None. FINDINGS: No acute fracture or subluxation. Mild to moderate degenerative disc disease noted at L1-2, L2-3 and L5-S1. 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 No suspicious focal bony lesions are present. IMPRESSION: 1. No acute bony abnormality 2. Mild-to-moderate degenerative changes. Electronically Signed   By: Harmon Pier M.D.   On: 12/30/2018 14:13    ____________________________________________   PROCEDURES  Procedure(s) performed (including Critical Care):  Procedures   ____________________________________________   INITIAL IMPRESSION / ASSESSMENT AND PLAN / ED COURSE  As part of my medical decision making, I reviewed the following data within the electronic MEDICAL RECORD NUMBER Notes from prior  ED visits and [place] Controlled Substance Database  Patient presents to the ED with complaint of low back pain for approximately 3 weeks.  Patient states that she fell when she attempted to sit down on her aunts seated walker and found that the brakes on it were broken.  Patient denies any head injury or loss of consciousness during this fall.  She states that over-the-counter medication has not helped with her discomfort.  Patient has continued to ambulate without any assistance.  Patient was given a prescription for prednisone tapering dose over the next 6 days.  She is also encouraged to use ice or heat to her back as needed for discomfort.  She is to follow-up with her PCP if any continued problems. ____________________________________________   FINAL  CLINICAL IMPRESSION(S) / ED DIAGNOSES  Final diagnoses:  Acute right-sided low back pain without sciatica     ED Discharge Orders         Ordered    predniSONE (DELTASONE) 10 MG tablet     12/30/18 1439           Note:  This document was prepared using Dragon voice recognition software and may include unintentional dictation errors.    Tommi Rumps, PA-C 12/30/18 1548    Arnaldo Natal, MD 12/30/18 [phone]

## 2020-09-03 ENCOUNTER — Ambulatory Visit
Admission: EM | Admit: 2020-09-03 | Discharge: 2020-09-03 | Disposition: A | Discharge status: Home / Self Care | Payer: BC Managed Care – PPO | Attending: Internal Medicine | Admitting: Internal Medicine

## 2020-09-03 ENCOUNTER — Other Ambulatory Visit: Payer: Self-pay

## 2020-09-03 ENCOUNTER — Encounter: Payer: Self-pay | Admitting: Emergency Medicine

## 2020-09-03 DIAGNOSIS — T50Z95A Adverse effect of other vaccines and biological substances, initial encounter: Secondary | ICD-10-CM | POA: Diagnosis not present

## 2020-09-03 DIAGNOSIS — T50B95A Adverse effect of other viral vaccines, initial encounter: Secondary | ICD-10-CM | POA: Insufficient documentation

## 2020-09-03 DIAGNOSIS — Z20822 Contact with and (suspected) exposure to covid-19: Secondary | ICD-10-CM | POA: Diagnosis not present

## 2020-09-03 DIAGNOSIS — R590 Localized enlarged lymph nodes: Secondary | ICD-10-CM | POA: Insufficient documentation

## 2020-09-03 DIAGNOSIS — Z880 Allergy status to penicillin: Secondary | ICD-10-CM | POA: Insufficient documentation

## 2020-09-03 DIAGNOSIS — R42 Dizziness and giddiness: Secondary | ICD-10-CM | POA: Insufficient documentation

## 2020-09-03 DIAGNOSIS — R051 Acute cough: Secondary | ICD-10-CM | POA: Diagnosis not present

## 2020-09-03 DIAGNOSIS — J069 Acute upper respiratory infection, unspecified: Secondary | ICD-10-CM | POA: Diagnosis not present

## 2020-09-03 LAB — SARS CORONAVIRUS 2 (TAT 6-24 HRS): SARS Coronavirus 2: NEGATIVE

## 2020-09-03 NOTE — ED Provider Notes (Signed)
[facility]    CSN: [id number] Arrival date & time: 09/03/20  1245      History   Chief Complaint Chief Complaint  Patient presents with  . Headache  . Dizziness  . Cough    HPI Olivia Bass is a 59 y.o. female who presents with complaints of HA and dizziness, arm soreness where she received the Pfizer Covid booster the day before. She is also getting nausea. She has had arm soreness with the prior shots, but did not have axilla lump. She also has had fatigue for a couple of days.  Symptoms this time are different. While sitting in front of her computer she felt near faint and has felt vertigo, and  if she moves her head or too quick things feel they are spinning.  Denies any illness or allergies acting up prior to the shot.   She developed post nasal drainage and cough started yesterday pm. Dizziness started prior to the nose congestion.  She talked with her PCP who advised her to come in to get a Covid test.  She is up to date on her mammograms.   Past Medical History:  Diagnosis Date  . Kidney stones   . 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 UTI (urinary tract infection)     There are no problems to display for this patient.   Past Surgical History:  Procedure Laterality Date  . DILATION AND CURETTAGE OF UTERUS     age 46 - miscarriage  . metal plate in right arm    . TONSILLECTOMY    . TONSILLECTOMY    . WISDOM TOOTH EXTRACTION     age 21    OB History    Gravida  1   Para      Term      Preterm      AB  1   Living        SAB  1   TAB      Ectopic      Multiple      Live Births               Home Medications    Prior to Admission medications   Medication Sig Start Date End Date Taking? Authorizing Provider  acetaminophen (TYLENOL) 325 MG tablet Take 650 mg by mouth every 6 (six) hours as needed for moderate pain.   Yes [provider]  Alpha-Lipoic Acid 600 MG TABS Take by mouth.   Yes [provider]  Cyanocobalamin (B-12) 3000 MCG CAPS Take by mouth.   Yes [provider]  Multiple Vitamin (MULTIVITAMIN) tablet Take 1 tablet by mouth daily.   Yes [provider]  Vitamin D, Ergocalciferol, (DRISDOL) 1.25 MG (50000 UNIT) CAPS capsule Take 50,000 Units by mouth once a week. 07/20/20  Yes [provider]  predniSONE (DELTASONE) 10 MG tablet Take 6 tablets  today, on day 2 take 5 tablets, day 3 take 4 tablets, day 4 take 3 tablets, day 5 take  2 tablets and 1 tablet the last day 12/30/18   Tommi Rumps, PA-C    Family History Family History  Problem Relation Age of Onset  . Diabetes Mother   . Hypertension Mother   . Arrhythmia Mother   . Kidney disease Mother   . Glaucoma Mother   . Cancer Father 53       lumphoma, lung, throat  . Anemia Sister   .  Breast cancer Maternal Aunt        80  . Cancer Maternal Aunt 50       uterine tumor    Social History Social History   Tobacco Use  . Smoking status: Never Smoker  . Smokeless tobacco: Never Used  Vaping Use  . Vaping Use: Never used  Substance Use Topics  . Alcohol use: Yes    Alcohol/week: 0.0 standard drinks    Comment: rarely  . Drug use: No     Allergies   Other, Penicillins, and Sulfur   Review of Systems Review of Systems  Constitutional: Positive for fatigue. Negative for activity change, appetite change, chills, diaphoresis and fever.  HENT: Positive for congestion, postnasal drip and sinus pressure. Negative for ear discharge, ear pain and sore throat.        Feels a little pressure   Eyes: Negative for discharge.  Respiratory: Positive for cough. Negative for chest tightness, shortness of breath and wheezing.   Cardiovascular: Negative for chest pain.  Gastrointestinal: Positive for nausea. Negative for diarrhea and vomiting.  Musculoskeletal: Negative for gait problem and myalgias.  Skin: Negative for rash and wound.  Neurological: Positive for dizziness and  headaches. Negative for syncope and weakness.  Hematological: Positive for adenopathy.   Physical Exam Triage Vital Signs ED Triage Vitals  Enc Vitals Group     BP 09/03/20 1306 132/80     Pulse Rate 09/03/20 1306 60     Resp 09/03/20 1306 18     Temp 09/03/20 1306 98.2 F (36.8 C)     Temp Source 09/03/20 1306 Oral     SpO2 09/03/20 1306 98 %     Weight --      Height 09/03/20 1304 5\' 5"  (1.651 m)     Head Circumference --      Peak Flow --      Pain Score 09/03/20 1303 6     Pain Loc --      Pain Edu? --      Excl. in GC? --    No data found.  Updated Vital Signs BP 132/80 (BP Location: Left Arm)   Pulse 60   Temp 98.2 F (36.8 C) (Oral)   Resp 18   Ht 5\' 5"  (1.651 m)   LMP  (LMP Unknown)   SpO2 98%   BMI 31.62 kg/m   Visual Acuity Right Eye Distance:   Left Eye Distance:   Bilateral Distance:    Right Eye Near:   Left Eye Near:    Bilateral Near:     Physical Exam  Physical Exam Vitals signs and nursing note reviewed.  Constitutional:      General: She is not in acute distress.    Appearance: Normal appearance. She is not ill-appearing, toxic-appearing or diaphoretic.  HENT:     Head: Normocephalic.     Right Ear: Tympanic membrane, ear canal and external ear normal.     Left Ear: Tympanic membrane, ear canal and external ear normal.     Nose: Nose normal.     Mouth/Throat:     Mouth: Mucous membranes are moist.  Eyes:     General: No scleral icterus.       Right eye: No discharge.        Left eye: No discharge.     Conjunctiva/sclera: Conjunctivae normal.  Neck:     Musculoskeletal: Neck supple. No neck rigidity.  Cardiovascular:     Rate and Rhythm: Normal rate and  regular rhythm.     Heart sounds: No murmur.  Pulmonary:     Effort: Pulmonary effort is normal.     Breath sounds: Normal breath sounds.  Abdominal:     General: Bowel sounds are normal. There is no distension.     Palpations: Abdomen is soft. There is no mass.      Tenderness: There is no abdominal tenderness. There is no guarding or rebound.     Hernia: No hernia is present.  Musculoskeletal: Normal range of motion. Site of injection on L deltoid is tender, but there are no lumps, redness or warmth.   Lymphadenopathy:     Cervical: No cervical adenopathy. Has a large firm mass under L axilla which is not mobile, is minimally tender. Skin does not show redness or heat. It is probable 4 x 7 cm size Skin:    General: Skin is warm and dry.     Coloration: Skin is not jaundiced.     Findings: No rash.  Neurological:     Mental Status: She is alert and oriented to person, place, and time.     Gait: Gait normal.  During finger to nose test she got a little wobbly. Rombers is neg. No face asymmetry. Upper and lower extremity strength 5/5 bilaterally.   Psychiatric:        Mood and Affect: Mood normal.        Behavior: Behavior normal.        Thought Content: Thought content normal.        Judgment: Judgment normal.    UC Treatments / Results  Labs (all labs ordered are listed, but only abnormal results are displayed) Labs Reviewed  SARS CORONAVIRUS 2 (TAT 6-24 HRS)    EKG   Radiology No results found.  Procedures Procedures (including critical care time)  Medications Ordered in UC Medications - No data to display  Initial Impression / Assessment and Plan / UC Course  I have reviewed the triage vital signs and the nursing notes. Pt was in a hurry to leave to care for her elderly family at home, and declined any further work up or get orthostatic testing. She told the nurse and me " I just came to get the Covid test" I encouraged her strongly to see PCP for L axilla mass.  Final Clinical Impressions(s) / UC Diagnoses   Final diagnoses:  None   Discharge Instructions   None    ED Prescriptions    None     PDMP not reviewed this encounter.   Garey Ham, PA-C 09/03/20 1355

## 2020-09-03 NOTE — Discharge Instructions (Signed)
Since you refused the orthostatic blood pressure readings to check for possible dehydration versus inner ear,  and are in a hurry to leave I do not know why you are dizzy for sure. The left arm pit swelling could be a reaction, but you need to have your family Dr see you and possibly have an ultrasound

## 2020-09-03 NOTE — ED Notes (Signed)
Patient refused orthostatic vital signs and blood work. States she just needed to be tested for COVID and needed to get back to her aunt at home. Notified [place], [place].

## 2020-09-03 NOTE — ED Triage Notes (Signed)
Patient states she had the Pfizer COVID booster on Saturday. She states on Sunday her arm was sore, she has a knot under her left arm and has a headache and dizziness.

## 2020-10-12 IMAGING — CR DG LUMBAR SPINE 2-3V
1 series · 3 of 3 positions shown · non-contrast
Comparison: None.

CLINICAL DATA: Acute low back pain following fall several weeks
ago. Initial encounter.

EXAM:
LUMBAR SPINE - 2-3 VIEW

[Series 1: dg lumbar spine 2-3 views · 0.14mm/px · 3 of 3 slices shown]
[im 1/3]
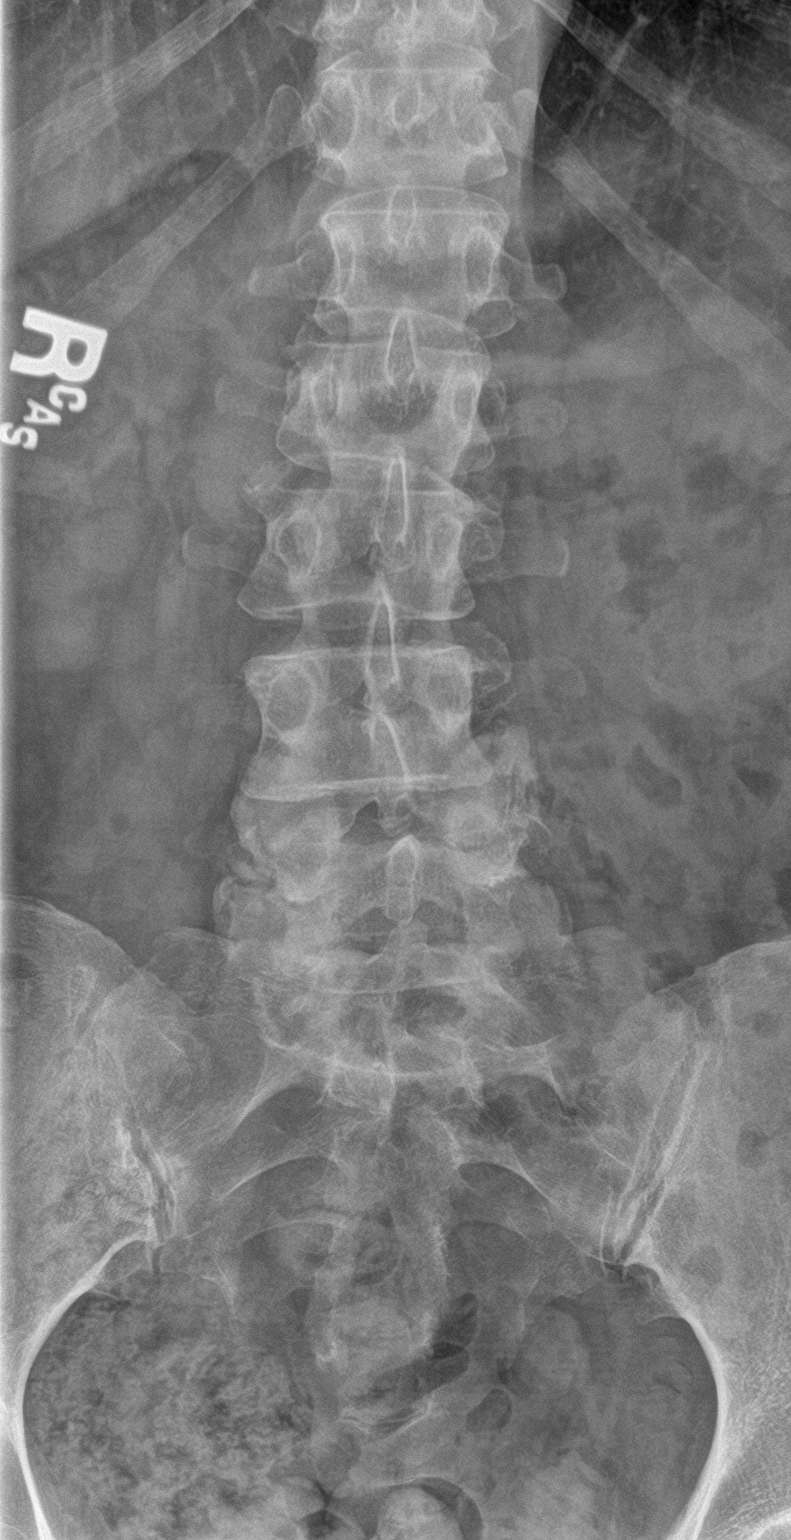
[im 2/3]
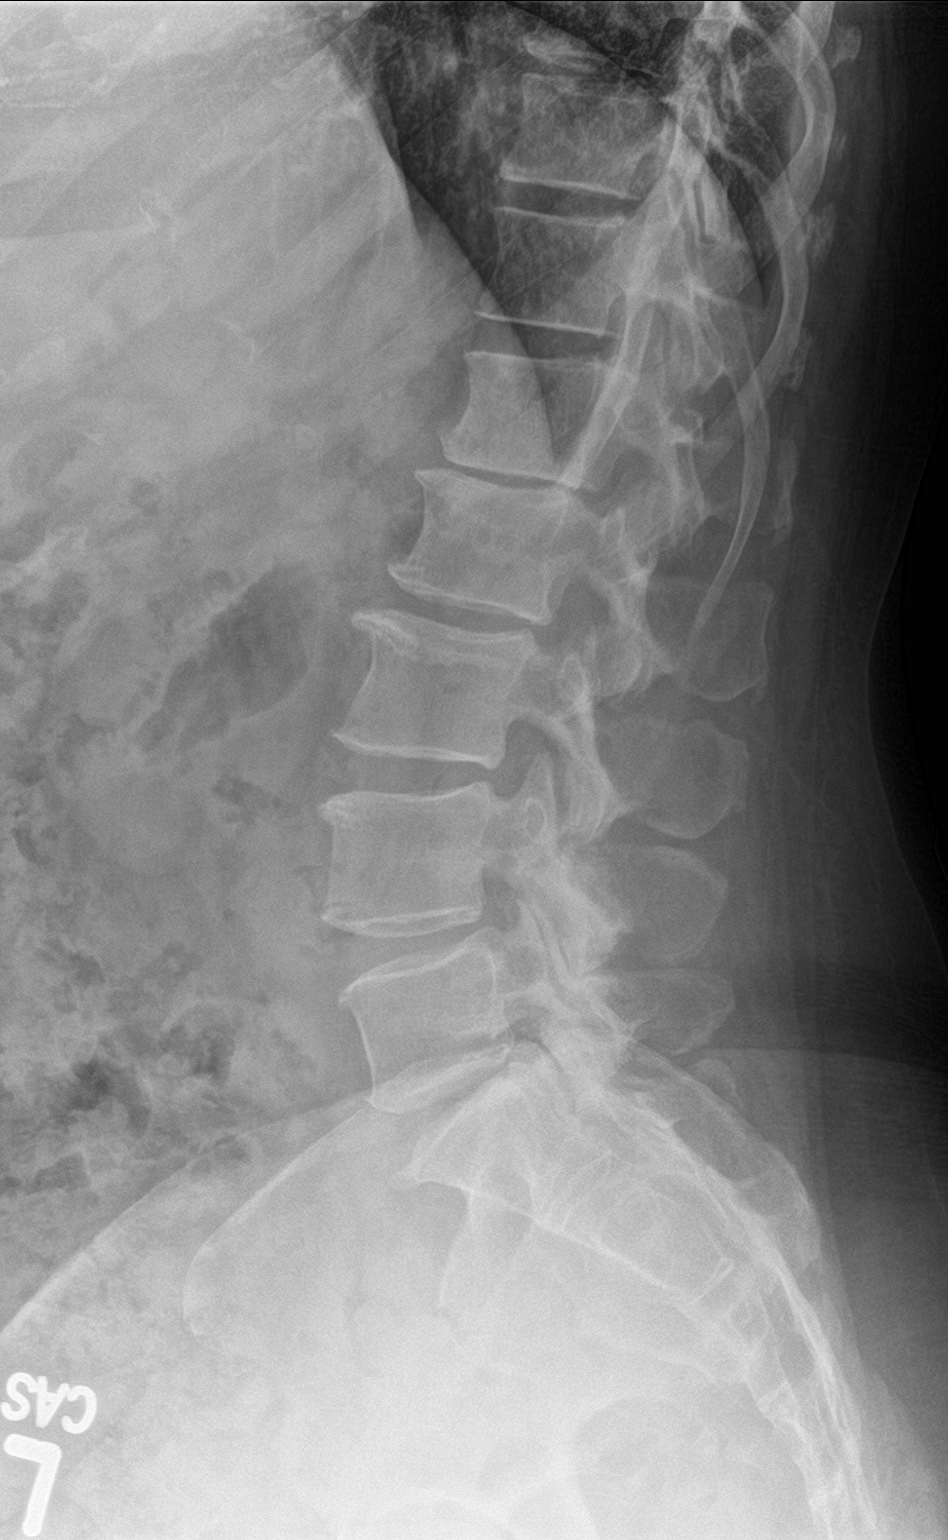
[im 3/3]
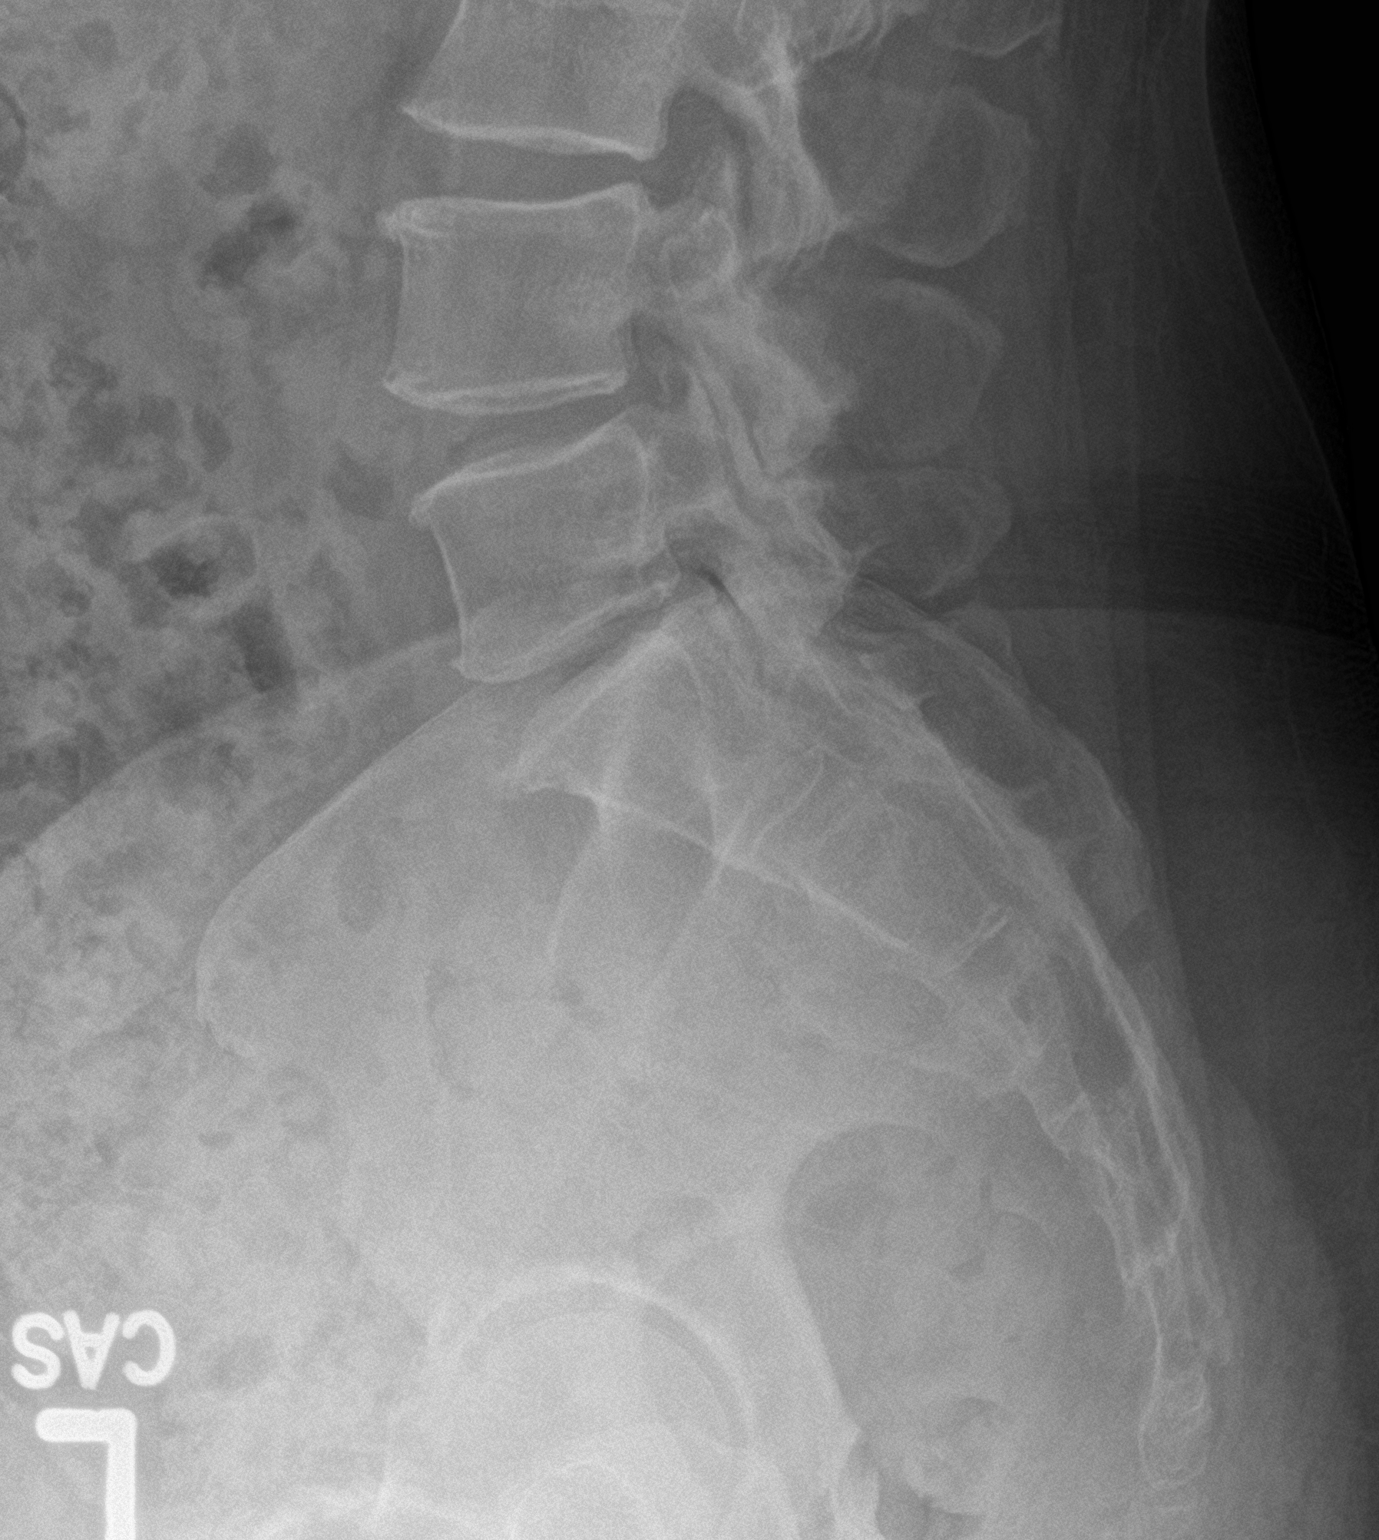

[3 of 3 positions shown; findings below may reference images not displayed]

FINDINGS: No acute fracture or subluxation.

Mild to moderate degenerative disc disease noted at L1-2, L2-3 and
L5-S1..

Facet arthropathy in the LOWER lumbar spine identified.

No suspicious focal bony lesions are present.
IMPRESSION: 1. No acute bony abnormality
2. Mild-to-moderate degenerative changes.

## 2021-02-04 ENCOUNTER — Other Ambulatory Visit: Payer: Self-pay

## 2021-02-04 ENCOUNTER — Ambulatory Visit (INDEPENDENT_AMBULATORY_CARE_PROVIDER_SITE_OTHER): Payer: BC Managed Care – PPO | Admitting: Dermatology

## 2021-02-04 DIAGNOSIS — L820 Inflamed seborrheic keratosis: Secondary | ICD-10-CM

## 2021-02-04 DIAGNOSIS — L821 Other seborrheic keratosis: Secondary | ICD-10-CM

## 2021-02-04 DIAGNOSIS — L578 Other skin changes due to chronic exposure to nonionizing radiation: Secondary | ICD-10-CM

## 2021-02-04 NOTE — Patient Instructions (Addendum)
Cryotherapy Aftercare  . Wash gently with soap and water everyday.   . Apply Vaseline and Band-Aid daily until healed.    Recommend daily broad spectrum sunscreen SPF 30+ to sun-exposed areas, reapply every 2 hours as needed. Call for new or changing lesions.  Staying in the shade or wearing long sleeves, sun glasses (UVA+UVB protection) and wide brim hats (4-inch brim around the entire circumference of the hat) are also recommended for sun protection.    
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
## 2021-02-15 ENCOUNTER — Encounter: Payer: Self-pay | Admitting: Dermatology

## 2021-06-17 ENCOUNTER — Other Ambulatory Visit: Payer: Self-pay

## 2021-06-17 ENCOUNTER — Ambulatory Visit (INDEPENDENT_AMBULATORY_CARE_PROVIDER_SITE_OTHER): Payer: BC Managed Care – PPO | Admitting: Dermatology

## 2021-06-17 DIAGNOSIS — L820 Inflamed seborrheic keratosis: Secondary | ICD-10-CM | POA: Diagnosis not present

## 2021-06-17 DIAGNOSIS — L918 Other hypertrophic disorders of the skin: Secondary | ICD-10-CM

## 2021-06-17 DIAGNOSIS — L821 Other seborrheic keratosis: Secondary | ICD-10-CM | POA: Diagnosis not present

## 2021-06-17 NOTE — Progress Notes (Signed)
   Follow-Up Visit   Subjective  Olivia Bass is a 60 y.o. female who presents for the following: ISK's  (Of the B/L breast - recheck and possibly treat, previously treated with LN2) and bumps around the neck (Patient unsure how long they have been there and would like them checked today to discuss treatment options.).  The following portions of the chart were reviewed this encounter and updated as appropriate:   Tobacco  Allergies  Meds  Problems  Med Hx  Surg Hx  Fam Hx     Review of Systems:  No other skin or systemic complaints except as noted in HPI or Assessment and Plan.  Objective  Well appearing patient in no apparent distress; mood and affect are within normal limits.  A focused examination was performed including the chest, inframammary, back and neck. Relevant physical exam findings are noted in the Assessment and Plan.  R med chest x 1, L med chest x 1, upper mid back x 1, L mid back x 5 (8) Erythematous keratotic or waxy stuck-on papule or plaque.   Assessment & Plan  Inflamed seborrheic keratosis R med chest x 1, L med chest x 1, upper mid back x 1, L mid back x 5  symptomatic Prior to procedure, discussed risks of blister formation, small wound, skin dyspigmentation, or rare scar following cryotherapy. Recommend Vaseline ointment to treated areas while healing.   Destruction of lesion - R med chest x 1, L med chest x 1, upper mid back x 1, L mid back x 5 Complexity: simple   Destruction method: cryotherapy   Informed consent: discussed and consent obtained   Timeout:  patient name, date of birth, surgical site, and procedure verified Lesion destroyed using liquid nitrogen: Yes   Region frozen until ice ball extended beyond lesion: Yes   Outcome: patient tolerated procedure well with no complications   Post-procedure details: wound care instructions given    Seborrheic Keratoses - Stuck-on, waxy, tan-brown papules and/or plaques  - Benign-appearing -  Discussed benign etiology and prognosis. - Observe - Call for any changes  Acrochordons (Skin Tags) - Fleshy, skin-colored pedunculated papules - Benign appearing.  - Observe. - If desired, they can be removed with an in office procedure that is not covered by insurance. - Please call the clinic if you notice any new or changing lesions.   Return if symptoms worsen or fail to improve.  Maylene Roes, CMA, am acting as scribe for Darden Dates, MD .   Documentation: I have reviewed the above documentation for accuracy and completeness, and I agree with the above.  Darden Dates, MD

## 2021-06-17 NOTE — Patient Instructions (Signed)

## 2021-06-25 ENCOUNTER — Encounter: Payer: Self-pay | Admitting: Dermatology

## 2021-06-28 ENCOUNTER — Other Ambulatory Visit: Payer: Self-pay

## 2021-06-28 ENCOUNTER — Ambulatory Visit
Admission: EM | Admit: 2021-06-28 | Discharge: 2021-06-28 | Disposition: A | Discharge status: Home / Self Care | Payer: BC Managed Care – PPO | Attending: Physician Assistant | Admitting: Physician Assistant

## 2021-06-28 DIAGNOSIS — N898 Other specified noninflammatory disorders of vagina: Secondary | ICD-10-CM | POA: Insufficient documentation

## 2021-06-28 DIAGNOSIS — S0591XA Unspecified injury of right eye and orbit, initial encounter: Secondary | ICD-10-CM | POA: Insufficient documentation

## 2021-06-28 DIAGNOSIS — R300 Dysuria: Secondary | ICD-10-CM | POA: Diagnosis present

## 2021-06-28 LAB — URINALYSIS, COMPLETE (UACMP) WITH MICROSCOPIC
Bilirubin Urine: NEGATIVE
Glucose, UA: NEGATIVE mg/dL
Hgb urine dipstick: NEGATIVE
Ketones, ur: NEGATIVE mg/dL
Leukocytes,Ua: NEGATIVE
Nitrite: NEGATIVE
Protein, ur: NEGATIVE mg/dL
Specific Gravity, Urine: >1.03 — ABNORMAL HIGH (ref 1.005–1.030)
pH: 5.5 (ref 5.0–8.0)

## 2021-06-28 LAB — WET PREP, GENITAL
Clue Cells Wet Prep HPF POC: NONE SEEN
Sperm: NONE SEEN
Trich, Wet Prep: NONE SEEN
Yeast Wet Prep HPF POC: NONE SEEN

## 2021-06-28 MED ORDER — NYSTATIN 100000 UNIT/GM EX CREA: TOPICAL_CREAM | CUTANEOUS | 0 refills | Status: AC

## 2021-06-28 MED ORDER — PHENAZOPYRIDINE HCL 200 MG PO TABS: 200.0000 mg | ORAL_TABLET | Freq: Three times a day (TID) | ORAL | 0 refills | Status: DC

## 2021-06-28 NOTE — ED Provider Notes (Signed)
[facility]    CSN: [id number] Arrival date & time: 06/28/21  0840      History   Chief Complaint Chief Complaint  Patient presents with   Eye Problem   Dysuria   Vaginal Discharge    HPI Olivia Bass is a 60 y.o. female presenting with multiple complaints.  First she states she has had some dysuria and frequency as well as suprapubic pressure, vaginal itching and whitish-yellow discharge for the past several days.  She says she cannot be sure how long symptoms have been going on because she cares for others more than herself.  She denies any fever, fatigue, chills, back pain, hematuria.  Patient has concerns for possible UTI or yeast infection.  Has not used any OTC products for symptoms.  Additionally she does report history of the right eye.  She states that she was reaching to her bedside table and a Tiffany lamp fell onto her face.  She says this happened yesterday.  Since then she has noticed a bubble filled with clearish fluid on her eyeball.  She says her eye does not hurt and denies any changes in vision.  She does wear glasses.  She denies any wounds.  No photophobia, headaches or dizziness.  Patient does report that she has an ophthalmologist and does plan to see the ophthalmologist about this and she has a history of glaucoma.  She does continue to use her regular glaucoma eyedrops.  No other complaints.  HPI  Past Medical History:  Diagnosis Date   Kidney stones    Metal plates in right arm    montagia   UTI (urinary tract infection)     There are no problems to display for this patient.   Past Surgical History:  Procedure Laterality Date   DILATION AND CURETTAGE OF UTERUS     age 70 - miscarriage   metal plate in right arm     TONSILLECTOMY     TONSILLECTOMY     WISDOM TOOTH EXTRACTION     age 3    OB History     Gravida  1   Para      Term      Preterm      AB  1   Living         SAB  1   IAB      Ectopic      Multiple       Live Births               Home Medications    Prior to Admission medications   Medication Sig Start Date End Date Taking? Authorizing Provider  Alpha-Lipoic Acid 600 MG TABS Take by mouth.   Yes [provider]  nystatin cream (MYCOSTATIN) Apply to affected area 2 times daily 06/28/21 07/05/21 Yes Shirlee Latch, PA-C  phenazopyridine (PYRIDIUM) 200 MG tablet Take 1 tablet (200 mg total) by mouth 3 (three) times daily. 06/28/21  Yes Shirlee Latch, PA-C  acetaminophen (TYLENOL) 325 MG tablet Take 650 mg by mouth every 6 (six) hours as needed for moderate pain. Patient not taking: No sig reported    [provider]  Cyanocobalamin (B-12) 3000 MCG CAPS Take by mouth. Patient not taking: No sig reported    [provider]  Multiple Vitamin (MULTIVITAMIN) tablet Take 1 tablet by mouth daily. Patient not taking: No sig reported    [provider]  Vitamin D, Ergocalciferol, (DRISDOL) 1.25 MG (50000  UNIT) CAPS capsule Take 50,000 Units by mouth once a week. Patient not taking: No sig reported 07/20/20   [provider]    Family History Family History  Problem Relation Age of Onset   Diabetes Mother    Hypertension Mother    Arrhythmia Mother    Kidney disease Mother    Glaucoma Mother    Cancer Father 18       lumphoma, lung, throat   Anemia Sister    Breast cancer Maternal Aunt        79   Cancer Maternal Aunt 14       uterine tumor    Social History Social History   Tobacco Use   Smoking status: Never   Smokeless tobacco: Never  Vaping Use   Vaping Use: Never used  Substance Use Topics   Alcohol use: Yes    Alcohol/week: 0.0 standard drinks    Comment: rarely   Drug use: No     Allergies   Other, Penicillins, and Elemental sulfur   Review of Systems Review of Systems  Constitutional:  Negative for fatigue and fever.  Eyes:  Negative for photophobia, pain, discharge, redness, itching and visual disturbance.   Gastrointestinal:  Positive for abdominal pain (suprapubic pain) and constipation. Negative for diarrhea, nausea and vomiting.  Genitourinary:  Positive for dysuria, frequency, urgency and vaginal discharge. Negative for decreased urine volume, flank pain, hematuria, pelvic pain, vaginal bleeding and vaginal pain.  Musculoskeletal:  Negative for back pain.  Skin:  Negative for rash.    Physical Exam Triage Vital Signs ED Triage Vitals  Enc Vitals Group     BP 06/28/21 0853 130/83     Pulse Rate 06/28/21 0853 67     Resp 06/28/21 0853 18     Temp 06/28/21 0853 98.3 F (36.8 C)     Temp Source 06/28/21 0853 Oral     SpO2 06/28/21 0853 98 %     Weight 06/28/21 0851 215 lb (97.5 kg)     Height 06/28/21 0851 5\' 5"  (1.651 m)     Head Circumference --      Peak Flow --      Pain Score 06/28/21 0851 0     Pain Loc --      Pain Edu? --      Excl. in GC? --    No data found.  Updated Vital Signs BP 130/83 (BP Location: Left Arm)   Pulse 67   Temp 98.3 F (36.8 C) (Oral)   Resp 18   Ht 5\' 5"  (1.651 m)   Wt 215 lb (97.5 kg)   LMP  (LMP Unknown)   SpO2 98%   BMI 35.78 kg/m      Physical Exam Vitals and nursing note reviewed.  Constitutional:      General: She is not in acute distress.    Appearance: Normal appearance. She is not ill-appearing or toxic-appearing.  HENT:     Head: Normocephalic and atraumatic.  Eyes:     General: Lids are normal. Vision grossly intact. No scleral icterus.       Right eye: No discharge.        Left eye: No discharge.     Conjunctiva/sclera: Conjunctivae normal.      Comments: There is a small vesicle with clear fluid as indicated in picture.   Cardiovascular:     Rate and Rhythm: Normal rate and regular rhythm.     Heart sounds: Normal heart sounds.  Pulmonary:  Effort: Pulmonary effort is normal. No respiratory distress.     Breath sounds: Normal breath sounds.  Abdominal:     Palpations: Abdomen is soft.     Tenderness: There is  abdominal tenderness (mild TTP suprapubic region). There is no right CVA tenderness or left CVA tenderness.  Musculoskeletal:     Cervical back: Neck supple.  Skin:    General: Skin is dry.  Neurological:     General: No focal deficit present.     Mental Status: She is alert. Mental status is at baseline.     Motor: No weakness.     Gait: Gait normal.  Psychiatric:        Mood and Affect: Mood normal.        Behavior: Behavior normal.        Thought Content: Thought content normal.     UC Treatments / Results  Labs (all labs ordered are listed, but only abnormal results are displayed) Labs Reviewed  WET PREP, GENITAL - Abnormal; Notable for the following components:      Result Value   WBC, Wet Prep HPF POC FEW (*)    All other components within normal limits  URINALYSIS, COMPLETE (UACMP) WITH MICROSCOPIC - Abnormal; Notable for the following components:   Color, Urine AMBER (*)    Specific Gravity, Urine >1.030 (*)    Bacteria, UA RARE (*)    All other components within normal limits  URINE CULTURE    EKG   Radiology No results found.  Procedures Procedures (including critical care time)  Medications Ordered in UC Medications - No data to display  Initial Impression / Assessment and Plan / UC Course  I have reviewed the triage vital signs and the nursing notes.  Pertinent labs & imaging results that were available during my care of the patient were reviewed by me and considered in my medical decision making (see chart for details).  60 year old female presenting for dysuria and vaginal discharge.  Additionally complains of right eye injury.  UA does not appear to be a clean-catch.  It is positive for greater than 1.030 specific gravity but no evidence of UTI based on urinalysis.  Will send for culture given her symptoms.  Advised we will treat for UTI if culture is positive but until then I have sent in Pyridium and advised her to increase fluid intake since she  is dehydrated.  Wet prep shows few WBCs but no yeast, trichomoniasis or clue cells seen.  Reviewed with patient different causes of vaginal discharge and itching.  We will treat for possible yeast vaginitis with nystatin cream.  Advised her symptoms continue to follow-up with gynecologist to be evaluated for atrophic vaginitis or other conditions.  Patient appears to have a small seroma/vesicle of the right conjunctiva.  She is denying any eye pain, vision changes, light sensitivity, headaches, dizziness, syncope.  Does not appear to be a serious and likely minor injury.  Advised supportive care.  Patient still states she plans to follow up with her ophthalmologist in the next couple of days given her history of glaucoma.  Final Clinical Impressions(s) / UC Diagnoses   Final diagnoses:  Dysuria  Vaginal itching  Right eye injury, initial encounter     Discharge Instructions      As we discussed the urine does not appear to be consistent with a UTI.  I will send the urine for culture and treat you for UTI if it comes back with bacterial growth but for  now he should increase your fluid intake because you are dehydrated.  I have sent something in the meantime to help with the burning sensation.  If you develop fever, worsening back pain or abdominal pain you will need to be seen again.  The vaginal swab you obtained was normal.  No evidence of yeast or BV based on the swab.  As we discussed there is a condition called atrophic vaginitis that can occur in someone with age.  I have sent a cream to help with there is a fungal infection but if symptoms continue or worsen you may need to see a gynecologist.  Additionally, your eye injury does not appear serious.  You do not have any pain and no changes in vision or light sensitivity.  Since you have an ophthalmologist and a history of glaucoma, it is not a bad idea to follow-up with your ophthalmologist if still concerned.     ED Prescriptions      Medication Sig Dispense Auth. Provider   phenazopyridine (PYRIDIUM) 200 MG tablet Take 1 tablet (200 mg total) by mouth 3 (three) times daily. 6 tablet Eusebio Friendly B, PA-C   nystatin cream (MYCOSTATIN) Apply to affected area 2 times daily 30 g Shirlee Latch, [place]      PDMP not reviewed this encounter.   Shirlee Latch, PA-C 06/28/21 1007

## 2021-06-28 NOTE — Discharge Instructions (Addendum)
As we discussed the urine does not appear to be consistent with a UTI.  I will send the urine for culture and treat you for UTI if it comes back with bacterial growth but for now he should increase your fluid intake because you are dehydrated.  I have sent something in the meantime to help with the burning sensation.  If you develop fever, worsening back pain or abdominal pain you will need to be seen again.  The vaginal swab you obtained was normal.  No evidence of yeast or BV based on the swab.  As we discussed there is a condition called atrophic vaginitis that can occur in someone with age.  I have sent a cream to help with there is a fungal infection but if symptoms continue or worsen you may need to see a gynecologist.  Additionally, your eye injury does not appear serious.  You do not have any pain and no changes in vision or light sensitivity.  Since you have an ophthalmologist and a history of glaucoma, it is not a bad idea to follow-up with your ophthalmologist if still concerned.

## 2021-06-28 NOTE — ED Triage Notes (Signed)
Pt here with C/O bump on eye ball from something falling on her face. Pt C/O burning while peeing and discharge with itching.

## 2021-06-29 LAB — URINE CULTURE: Culture: NO GROWTH

## 2022-06-27 ENCOUNTER — Ambulatory Visit: Payer: BC Managed Care – PPO | Attending: Podiatry

## 2022-06-27 DIAGNOSIS — M25571 Pain in right ankle and joints of right foot: Secondary | ICD-10-CM | POA: Diagnosis present

## 2022-06-27 DIAGNOSIS — M25671 Stiffness of right ankle, not elsewhere classified: Secondary | ICD-10-CM | POA: Insufficient documentation

## 2022-06-27 NOTE — Therapy (Signed)
OUTPATIENT PHYSICAL THERAPY LOWER EXTREMITY EVALUATION   Patient Name: Olivia Bass MRN: [id number] DOB:25-Mar-1961, 61 y.o., female Today's Date: 06/27/2022   PT End of Session - 06/27/22 1429     Visit Number 1    Number of Visits 30   combined PT/OT/ST   Date for PT Re-Evaluation 08/08/22    PT Start Time 1429    PT Stop Time 1525    PT Time Calculation (min) 56 min    Activity Tolerance Patient tolerated treatment well;Patient limited by pain    Behavior During Therapy [facility] for tasks assessed/performed             Past Medical History:  Diagnosis Date   Kidney stones    Metal plates in right arm    montagia   UTI (urinary tract infection)    Past Surgical History:  Procedure Laterality Date   DILATION AND CURETTAGE OF UTERUS     age 22 - miscarriage   metal plate in right arm     TONSILLECTOMY     TONSILLECTOMY     WISDOM TOOTH EXTRACTION     age 35   There are no problems to display for this patient.   PCP: Barbette Reichmann, MD  REFERRING PROVIDER: Gwyneth Revels, DPM  REFERRING DIAG: R ankle pain  THERAPY DIAG:  Pain in right ankle and joints of right foot  Stiffness of right ankle, not elsewhere classified  Rationale for Evaluation and Treatment Rehabilitation  ONSET DATE: 2020  SUBJECTIVE:   SUBJECTIVE STATEMENT: Pt arrives to OPPT with c/o R ankle pain. 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 Pt has noticed decreased R ankle DF AROM compared to the L and notes increased pain in the achilles tendon with PF. Reports of N/T radiating into 2nd-4th digits of R foot.   PERTINENT HISTORY: Pt reports hx of multiple ankle injuries over the years including her aunt stepping on her feet, twisting ankle on the stairs, and falling. Pt has had multiple x-rays of R ankle with unremarkable results. 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 Deficits include: painful/numb toes (2nd-4th), intermittent episodes of contracted EHL, has to use AD sometimes when she has difficulty with WB, prolonged static standing <5 min, prolonged sitting, staying and falling asleep, increased difficulty with IADL's due to foot pain, repeated rest breaks with IADL's, pain with driving, balance.   PAIN:  Are you having pain? 5/10 pain in R dorsum of foot, constant ache and tingling into toes  PRECAUTIONS: None  WEIGHT BEARING RESTRICTIONS No  FALLS:  Has patient fallen in last 6 months? No  LIVING ENVIRONMENT: Lives with: lives alone Lives in: House/apartment 2-story house; can stay on main level if needed Stairs: Yes: Internal: 8+8 steps; can reach both and External: threshold (front 2"/back 6") steps; none Has following equipment at home: Single point cane, Walker - 2 wheeled, Environmental consultant - 4 wheeled, Wheelchair (manual), shower chair, and Grab bars  OCCUPATION: [employer]; work from home at [employer]; full-time  PLOF: Independent  PATIENT GOALS to be able to walk again without pain; wants to be able  to use treadmill   OBJECTIVE:   DIAGNOSTIC FINDINGS: imaging negative for acute fracture of R ankle/foot  PATIENT SURVEYS:  FOTO 26%  COGNITION:  Overall cognitive status: Within functional limits for tasks assessed     SENSATION (RLE): Light touch intact; allodynia at mortise joint, lateral malleoli, and 2nd-4th digits .  Complaints of tingling and burning of 2nd-4th digits  EDEMA:  +1 non pitting edema of R ankle   POSTURE: No Significant postural limitations  PALPATION: RLE: TTP along lateral malleoli and ATFL. TTP and increased mm tone noted to EHL, EDL, gastroc, and  anterior tibialis.  LOWER EXTREMITY ROM:  Active ROM Right eval Left eval  Hip flexion    Hip extension    Hip abduction    Hip adduction    Hip internal rotation    Hip external rotation    Knee flexion    Knee extension    Ankle dorsiflexion 15deg from resting position, p WFL  Ankle plantarflexion 10deg, p WNL  Ankle inversion 20deg, p WNL  Ankle eversion 10deg, p WNL   (Blank rows = not tested)  LOWER EXTREMITY MMT:  MMT Right eval Left eval  Hip flexion    Hip extension    Hip abduction    Hip adduction    Hip internal rotation    Hip external rotation    Knee flexion    Knee extension    Ankle dorsiflexion 3+ 4+  Ankle plantarflexion 3- 4+  Ankle inversion 4 4+  Ankle eversion 4 4+   (Blank rows = not tested)  LOWER EXTREMITY SPECIAL TESTS:   Attempted anterior drawer (ATFL) and talar tilt testing but unable to get reliable outcome due to increased guarding, despite Jendrasik techniques.  FUNCTIONAL TESTS:  NT today due to time constraint; will benefit from balance assessment in future visits  GAIT: Distance walked: 55ft Assistive device utilized:  using umbrella as SPC Level of assistance: Modified independence Comments: slightly antalgic gait with R stance    TODAY'S TREATMENT: Moist hot pack (MHP), to R lower leg (anterior tibialis and posterior calf) STM to R anterior tibialis and EDL/EHL post MHP R ankle alphabet Standing gastroc stretch x30s Standing soleus stretch x30s   PATIENT EDUCATION:  Education details: pain management techniques, using heat at home, stretching, prognosis, self-care activities daily to reduce stress Person educated: Patient Education method: Explanation, Demonstration, Tactile cues, and Handouts Education comprehension: verbalized understanding   HOME EXERCISE PROGRAM: Access Code: [email or web address] URL: [email or web address] Date: 06/27/2022 Prepared by: Carlena Hurl  Exercises - Seated Ankle  Alphabet  - 1 x daily - 7 x weekly - 3 sets - 10 reps - Standing Gastroc Stretch  - 1 x daily - 7 x weekly - 3 sets - 10 reps - Standing Ankle Dorsiflexion Stretch  - 1 x daily - 7 x weekly - 3 sets - 10 reps  ASSESSMENT:  CLINICAL IMPRESSION: Patient is a 61 y.o. F who was seen today for physical therapy evaluation and treatment for R ankle pain which has been affecting independence and tolerance with ADL's, IADL's, driving, sleeping, standing, and walking. Increased mm tone of R tibialis anterior, EDL, EHL, gastroc, and soleus likely contributing to ankle impairments. Pt able to demonstrate improvements in mm tone after heat and STM to affected area. Unable to tolerate ankle special tests due to increased pain and guarding. Provided HEP and encouraged self-care activities daily for stress and pain management due to reports of stress and recent loss of her  aunt. Pt presents with the below impairments, which affect the below activity/participation activities. She will benefit from skilled OPPT services to address deficits, improve overall QoL, and to improve safety/Ind with functional mobility.   OBJECTIVE IMPAIRMENTS Abnormal gait, decreased activity tolerance, decreased balance, decreased mobility, difficulty walking, decreased ROM, decreased strength, increased edema, increased fascial restrictions, impaired flexibility, impaired sensation, and pain.   ACTIVITY LIMITATIONS sitting, standing, squatting, sleeping, stairs, transfers, bed mobility, dressing, and locomotion level  PARTICIPATION LIMITATIONS: meal prep, cleaning, laundry, driving, shopping, community activity, occupation, and yard work  PERSONAL FACTORS Education, Past/current experiences, Time since onset of injury/illness/exacerbation, and 3+ comorbidities: HTN, obesity, general sensory neuropathy  are also affecting patient's functional outcome.   REHAB POTENTIAL: Good  CLINICAL DECISION MAKING: Stable/uncomplicated  EVALUATION  COMPLEXITY: Moderate   GOALS: Goals reviewed with patient? Yes  SHORT TERM GOALS: Target date: 07/18/2022  Pt will improve FOTO score to 52% indicating improved disability score per this outcome measure. Baseline: 26% Goal status: INITIAL    LONG TERM GOALS: Target date: 08/08/2022   Pt will report 1-2/10 pain at it's worst, to improve overall tolerance with ADL's, IADL's, and functional mobility. Baseline: can go up to 7/10 Goal status: INITIAL  2.  Pt will improve gross R ankle strength to 4+/5, indicating improved strength for safe balance and gait without AD. Baseline: 3-/5 to 4/5 Goal status: INITIAL  3.  Pt will improve gross R ankle AROM by at least 15deg, indicating improved ankle mobility required for safe and Ind gait. Baseline: 15deg from neutral (DF), 10deg from neutral (PF), 20deg (IV), 10deg (EV) Goal status: INITIAL  4.  Pt will be able to tolerate >5 min of static standing indicating improved safety and tolerance with upright functional mobility/IADL's. Baseline: <51min before needing to move/sit Goal status: INITIAL    PLAN: PT FREQUENCY: 2x/week  PT DURATION: 6 weeks  PLANNED INTERVENTIONS: Therapeutic exercises, Therapeutic activity, Neuromuscular re-education, Balance training, Gait training, Patient/Family education, Self Care, Joint mobilization, Joint manipulation, Stair training, DME instructions, Dry Needling, Electrical stimulation, Cryotherapy, Moist heat, Splintting, Taping, Traction, Ultrasound, Ionotophoresis 4mg /ml Dexamethasone, Manual therapy, and Re-evaluation  PLAN FOR NEXT SESSION: update HEP, balance outcome measures, STM/IASTM/Dry Needling, R ankle strengthening/ROM    Herminio Commons, PT, DPT 4:08 PM,06/27/22 Physical Therapist - [facility]

## 2022-06-30 ENCOUNTER — Ambulatory Visit: Payer: BC Managed Care – PPO

## 2022-06-30 DIAGNOSIS — M25571 Pain in right ankle and joints of right foot: Secondary | ICD-10-CM | POA: Diagnosis not present

## 2022-06-30 DIAGNOSIS — M25671 Stiffness of right ankle, not elsewhere classified: Secondary | ICD-10-CM

## 2022-06-30 NOTE — Therapy (Signed)
OUTPATIENT PHYSICAL THERAPY LOWER EXTREMITY EVALUATION   Patient Name: Olivia Bass MRN: [id number] DOB:1961/05/07, 61 y.o., female Today's Date: 06/30/2022   PT End of Session - 06/30/22 1331     Visit Number 2    Number of Visits 30   combined PT/OT/ST   Date for PT Re-Evaluation 08/08/22    Progress Note Due on Visit 10    PT Start Time 1330    PT Stop Time 1413    PT Time Calculation (min) 43 min    Activity Tolerance Patient tolerated treatment well;Patient limited by pain    Behavior During Therapy WFL for tasks assessed/performed              Past Medical History:  Diagnosis Date   Kidney stones    Metal plates in right arm    montagia   UTI (urinary tract infection)    Past Surgical History:  Procedure Laterality Date   DILATION AND CURETTAGE OF UTERUS     age 45 - miscarriage   metal plate in right arm     TONSILLECTOMY     TONSILLECTOMY     WISDOM TOOTH EXTRACTION     age 62   There are no problems to display for this patient.   PCP: Tracie Harrier, MD  REFERRING PROVIDER: Samara Deist, DPM  REFERRING DIAG: R ankle pain  THERAPY DIAG:  Pain in right ankle and joints of right foot  Stiffness of right ankle, not elsewhere classified  Rationale for Evaluation and Treatment Rehabilitation  ONSET DATE: 2020  SUBJECTIVE:   SUBJECTIVE STATEMENT: Pt arrives to OPPT with c/o R ankle pain. 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 Pt has noticed decreased R ankle DF AROM compared to the L and notes increased pain in the achilles tendon with PF. Reports of N/T radiating into 2nd-4th digits of R foot.   PERTINENT HISTORY: Pt reports hx of multiple ankle injuries over the years including her aunt stepping on her feet, twisting ankle on the stairs, and falling. Pt has had multiple x-rays of R ankle  with unremarkable results. 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 Deficits include: painful/numb toes (2nd-4th), intermittent episodes of contracted EHL, has to use AD sometimes when she has difficulty with WB, prolonged static standing <5 min, prolonged sitting, staying and falling asleep, increased difficulty with IADL's due to foot pain, repeated rest breaks with IADL's, pain with driving, balance.   PAIN:  Are you having pain? 4./10 pain in R dorsum of foot, constant ache and tingling into toes  PRECAUTIONS: None  WEIGHT BEARING RESTRICTIONS No  FALLS:  Has patient fallen in last 6 months? 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  Complaints of tingling and burning of 2nd-4th digits  EDEMA:  +1 non pitting edema of R ankle   POSTURE: No Significant postural limitations  PALPATION: RLE: TTP along lateral malleoli and ATFL. TTP and increased mm  tone noted to EHL, EDL, gastroc, and anterior tibialis.  LOWER EXTREMITY ROM:  Active ROM Right eval Left eval  Hip flexion    Hip extension    Hip abduction    Hip adduction    Hip internal rotation    Hip external rotation    Knee flexion    Knee extension    Ankle dorsiflexion 15deg from resting position, p WFL  Ankle plantarflexion 10deg, p WNL  Ankle inversion 20deg, p WNL  Ankle eversion 10deg, p WNL   (Blank rows = not tested)  LOWER EXTREMITY MMT:  MMT Right eval Left eval  Hip flexion    Hip extension    Hip abduction    Hip adduction    Hip internal rotation    Hip external rotation    Knee flexion    Knee extension    Ankle dorsiflexion 3+ 4+  Ankle plantarflexion 3- 4+  Ankle inversion 4 4+  Ankle eversion 4 4+   (Blank rows = not tested)  LOWER EXTREMITY SPECIAL TESTS:   Attempted anterior drawer (ATFL) and talar tilt testing but unable to get reliable outcome due to increased guarding, despite Jendrasik techniques.  FUNCTIONAL TESTS:  NT today due to time constraint; will benefit from balance assessment in future visits  GAIT: Distance walked: 68ft Assistive device utilized:  using umbrella as SPC Level of assistance: Modified independence Comments: slightly antalgic gait with R stance    TODAY'S TREATMENT:   Manual Therapy:  -PROM to right ankle- DF/PF/IV/EV and extensor hallicus longus. (Within pain limitations) - Limited to around neurtral with DF today due to pain. -STM to R anterior tibialis, peroneals, calf and EHL  -Rolling stick- to right ant tib- Instructed patient  in self usage   Therex:  -R ankle AROM with DF/PF/EV/IV, ankle circles CW and CCW x 20 reps each direction  -R Foot intrinsic (Splaying toes) x 10 reps (increase soreness)   -R Toe yoga (10 reps of toe ext w 4 digit flex) and vice versa x 10 reps.   -R Arch lifts x 10 reps    PATIENT EDUCATION:  Education details: pain management techniques, using heat at  home, stretching, prognosis, self-care activities daily to reduce stress Person educated: Patient Education method: Explanation, Demonstration, Tactile cues, and Handouts Education comprehension: verbalized understanding   HOME EXERCISE PROGRAM: Access Code: [email or web address] URL: [email or web address] Date: 06/30/2022 Prepared by: Maureen Ralphs  Exercises - Toe Yoga - Alternating Great Toe and Lesser Toe Extension  - 1 x daily - 7 x weekly - 3 sets - 10 reps - Arch Lifting  - 1 x daily - 7 x weekly - 3 sets - 10 reps - Toe Spreading  - 1 x daily - 7 x weekly - 3 sets - 10 reps   Access Code: [email or web address] URL: [email or web address] Date: 06/27/2022 Prepared by: Carlena Hurl  Exercises - Seated  Ankle Alphabet  - 1 x daily - 7 x weekly - 3 sets - 10 reps - Standing Gastroc Stretch  - 1 x daily - 7 x weekly - 3 sets - 10 reps - Standing Ankle Dorsiflexion Stretch  - 1 x daily - 7 x weekly - 3 sets - 10 reps  ASSESSMENT:  CLINICAL IMPRESSION: Patient presents with good understanding of previously instructed HEP. She reports ongoing right ankle/foot pain and limited ROM. She responded fairly to manual therapy. Author modified to very gentle mobs/PROM and later instructed patient in self rolling of tight ant tib/peroneals/calf/achilles. She exhibited ability to illicit intrinsic mm strength in toes but reports does increase her pain slightly. She will benefit from continued skilled OPPT services to address her limited right ankle, pain and difficulty with mobility to improve her overall quality of life, and to improve safety/Ind with functional mobility.   OBJECTIVE IMPAIRMENTS Abnormal gait, decreased activity tolerance, decreased balance, decreased mobility, difficulty walking, decreased ROM, decreased strength, increased edema, increased fascial restrictions, impaired flexibility, impaired sensation, and pain.   ACTIVITY LIMITATIONS sitting, standing, squatting,  sleeping, stairs, transfers, bed mobility, dressing, and locomotion level  PARTICIPATION LIMITATIONS: meal prep, cleaning, laundry, driving, shopping, community activity, occupation, and yard work  PERSONAL FACTORS Education, Past/current experiences, Time since onset of injury/illness/exacerbation, and 3+ comorbidities: HTN, obesity, general sensory neuropathy  are also affecting patient's functional outcome.   REHAB POTENTIAL: Good  CLINICAL DECISION MAKING: Stable/uncomplicated  EVALUATION COMPLEXITY: Moderate   GOALS: Goals reviewed with patient? Yes  SHORT TERM GOALS: Target date: 07/21/2022  Pt will improve FOTO score to 52% indicating improved disability score per this outcome measure. Baseline: 26% Goal status: INITIAL    LONG TERM GOALS: Target date: 08/11/2022   Pt will report 1-2/10 pain at it's worst, to improve overall tolerance with ADL's, IADL's, and functional mobility. Baseline: can go up to 7/10 Goal status: INITIAL  2.  Pt will improve gross R ankle strength to 4+/5, indicating improved strength for safe balance and gait without AD. Baseline: 3-/5 to 4/5 Goal status: INITIAL  3.  Pt will improve gross R ankle AROM by at least 15deg, indicating improved ankle mobility required for safe and Ind gait. Baseline: 15deg from neutral (DF), 10deg from neutral (PF), 20deg (IV), 10deg (EV) Goal status: INITIAL  4.  Pt will be able to tolerate >5 min of static standing indicating improved safety and tolerance with upright functional mobility/IADL's. Baseline: <19min before needing to move/sit Goal status: INITIAL    PLAN: PT FREQUENCY: 2x/week  PT DURATION: 6 weeks  PLANNED INTERVENTIONS: Therapeutic exercises, Therapeutic activity, Neuromuscular re-education, Balance training, Gait training, Patient/Family education, Self Care, Joint mobilization, Joint manipulation, Stair training, DME instructions, Dry Needling, Electrical stimulation, Cryotherapy, Moist heat,  Splintting, Taping, Traction, Ultrasound, Ionotophoresis 4mg /ml Dexamethasone, Manual therapy, and Re-evaluation  PLAN FOR NEXT SESSION: Progress HEP, balance outcome measures, STM/IASTM/Dry Needling, R ankle strengthening/ROM    , PT 2:32 PM,06/30/22 Physical Therapist - [facility]

## 2022-07-08 DIAGNOSIS — H401121 Primary open-angle glaucoma, left eye, mild stage: Secondary | ICD-10-CM | POA: Diagnosis not present

## 2022-07-12 ENCOUNTER — Encounter: Payer: Self-pay | Admitting: Physical Therapy

## 2022-07-12 ENCOUNTER — Ambulatory Visit: Payer: BC Managed Care – PPO | Attending: Podiatry | Admitting: Physical Therapy

## 2022-07-12 DIAGNOSIS — M25671 Stiffness of right ankle, not elsewhere classified: Secondary | ICD-10-CM | POA: Diagnosis not present

## 2022-07-12 DIAGNOSIS — R262 Difficulty in walking, not elsewhere classified: Secondary | ICD-10-CM | POA: Diagnosis not present

## 2022-07-12 DIAGNOSIS — M6281 Muscle weakness (generalized): Secondary | ICD-10-CM | POA: Diagnosis not present

## 2022-07-12 DIAGNOSIS — M25571 Pain in right ankle and joints of right foot: Secondary | ICD-10-CM | POA: Insufficient documentation

## 2022-07-12 NOTE — Therapy (Signed)
OUTPATIENT PHYSICAL THERAPY LOWER EXTREMITY EVALUATION   Patient Name: Olivia Bass MRN: [id number] DOB:Aug 09, 1961, 61 y.o., female Today's Date: 07/12/2022   PT End of Session - 07/12/22 1511     Visit Number 3    Number of Visits 30   combined PT/OT/ST   Date for PT Re-Evaluation 08/08/22    Progress Note Due on Visit 10    PT Start Time 1516    PT Stop Time 1559    PT Time Calculation (min) 43 min    Activity Tolerance Patient tolerated treatment well;Patient limited by pain    Behavior During Therapy [facility] for tasks assessed/performed               Past Medical History:  Diagnosis Date   Kidney stones    Metal plates in right arm    montagia   UTI (urinary tract infection)    Past Surgical History:  Procedure Laterality Date   DILATION AND CURETTAGE OF UTERUS     age 89 - miscarriage   metal plate in right arm     TONSILLECTOMY     TONSILLECTOMY     WISDOM TOOTH EXTRACTION     age 21   There are no problems to display for this patient.   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 Pt reports she has been doing her exercises regularly and still feels like her ankle rom is improving but her ankle still feels stiff.   PERTINENT HISTORY: Pt reports hx of multiple ankle injuries over the years including her aunt stepping on her feet, twisting ankle on the stairs, and falling. Pt has had multiple x-rays of R ankle with unremarkable results. 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 Deficits include: painful/numb toes (2nd-4th), intermittent episodes of contracted EHL, has to use AD sometimes when she has difficulty with WB, prolonged static standing <5 min, prolonged sitting, staying and falling asleep, increased difficulty with IADL's due to foot pain, repeated rest breaks with IADL's, pain with driving, balance.   PAIN:  Are you having pain? 4./10 pain in R dorsum of foot, constant ache and tingling into toes  PRECAUTIONS: None  WEIGHT BEARING RESTRICTIONS No  FALLS:  Has patient fallen in last 6 months? No  LIVING ENVIRONMENT: Lives with: lives alone Lives in: House/apartment 2-story house; can stay on main level if needed Stairs: Yes: Internal: 8+8 steps; can reach both and External: threshold (front 2"/back 6") steps; none Has following equipment at home: Single point cane, Walker - 2 wheeled, Environmental consultant - 4 wheeled, Wheelchair (manual), shower chair, and Grab bars  OCCUPATION: [employer]; work from home at [employer]; full-time  PLOF: Independent  PATIENT GOALS to be able to walk again without pain; wants to be able to use treadmill   OBJECTIVE:   DIAGNOSTIC FINDINGS: imaging negative for acute fracture of R ankle/foot  PATIENT SURVEYS:  FOTO 26%  COGNITION:  Overall cognitive status: Within functional limits  for tasks assessed     SENSATION (RLE): Light touch intact; allodynia at mortise joint, lateral malleoli, and 2nd-4th digits .  Complaints of tingling and burning of 2nd-4th digits  EDEMA:  +1 non pitting edema of R ankle   POSTURE: No Significant postural limitations  PALPATION: RLE: TTP along lateral malleoli and ATFL. TTP and increased mm tone noted to EHL, EDL, gastroc, and anterior tibialis.  LOWER EXTREMITY ROM:  Active ROM Right eval Left eval  Hip flexion    Hip extension    Hip abduction    Hip adduction    Hip internal rotation    Hip  external rotation    Knee flexion    Knee extension    Ankle dorsiflexion 15deg from resting position, p WFL  Ankle plantarflexion 10deg, p WNL  Ankle inversion 20deg, p WNL  Ankle eversion 10deg, p WNL   (Blank rows = not tested)  LOWER EXTREMITY MMT:  MMT Right eval Left eval  Hip flexion    Hip extension    Hip abduction    Hip adduction    Hip internal rotation    Hip external rotation    Knee flexion    Knee extension    Ankle dorsiflexion 3+ 4+  Ankle plantarflexion 3- 4+  Ankle inversion 4 4+  Ankle eversion 4 4+   (Blank rows = not tested)  LOWER EXTREMITY SPECIAL TESTS:   Attempted anterior drawer (ATFL) and talar tilt testing but unable to get reliable outcome due to increased guarding, despite Jendrasik techniques.  FUNCTIONAL TESTS:  NT today due to time constraint; will benefit from balance assessment in future visits  GAIT: Distance walked: 11ft Assistive device utilized:  using umbrella as SPC Level of assistance: Modified independence Comments: slightly antalgic gait with R stance    TODAY'S TREATMENT:   Manual Therapy: *pain  -PROM to right ankle- DF/PF/IV/EV and extensor hallicus longus. (Within pain limitations) - Limited to around neurtral with DF today due to pain* -STM to R anterior tibialis, peroneals, calf and EHL, attempted mobilizations wit movement but pain was to severe to be effective and pain radiated from her foot to her hip with this activity.* -Rolling stick- to right ant tib, peroneals, and gastroc gently to prevent onset of severe pain*   Therex:  -R ankle AROM with DF/PF/EV/IV, ankle ABC x 1       PATIENT EDUCATION:  Education details: pain management techniques, using heat at home, stretching, prognosis, self-care activities daily to reduce stress Person educated: Patient Education method: Explanation, Demonstration, Tactile cues, and Handouts Education comprehension: verbalized understanding   HOME EXERCISE  PROGRAM: Access Code: [email or web address] URL: [email or web address] Date: 06/30/2022 Prepared by: Maureen Ralphs  Exercises - Toe Yoga - Alternating Great Toe and Lesser Toe Extension  - 1 x daily - 7 x weekly - 3 sets - 10 reps - Arch Lifting  - 1 x daily - 7 x weekly - 3 sets - 10 reps - Toe Spreading  - 1 x daily - 7 x weekly - 3 sets - 10 reps   Access Code: [email or web address] URL: [email or web address] Date: 06/27/2022 Prepared by: Carlena Hurl  Exercises - Seated Ankle Alphabet  - 1 x daily - 7 x weekly - 3 sets - 10 reps - Standing Gastroc Stretch  - 1 x daily - 7 x weekly - 3 sets - 10 reps - Standing Ankle Dorsiflexion Stretch  - 1 x daily - 7 x weekly - 3 sets - 10 reps  ASSESSMENT:  CLINICAL IMPRESSION: Patient presents with good understanding of previously instructed HEP. She reports ongoing right ankle/foot pain and limited ROM but this has improved. She responded fairly to manual therapy, but had significant pain and was hesitant to report increase in symptoms with certain interventions. Author modified to very gentle mobs/PROM. She will benefit from continued skilled OPPT services to address her limited right ankle, pain and difficulty with mobility to improve her overall quality of life, and to improve safety/Ind with functional mobility.   OBJECTIVE IMPAIRMENTS Abnormal gait, decreased activity tolerance, decreased balance, decreased mobility, difficulty walking, decreased ROM, decreased strength, increased edema, increased fascial restrictions, impaired flexibility, impaired sensation, and pain.   ACTIVITY LIMITATIONS sitting, standing, squatting, sleeping, stairs, transfers, bed mobility, dressing, and locomotion level  PARTICIPATION LIMITATIONS: meal prep, cleaning, laundry, driving, shopping, community activity, occupation, and yard work  PERSONAL FACTORS Education, Past/current experiences, Time since onset of injury/illness/exacerbation, and 3+  comorbidities: HTN, obesity, general sensory neuropathy  are also affecting patient's functional outcome.   REHAB POTENTIAL: Good  CLINICAL DECISION MAKING: Stable/uncomplicated  EVALUATION COMPLEXITY: Moderate   GOALS: Goals reviewed with patient? Yes  SHORT TERM GOALS: Target date: 08/02/2022  Pt will improve FOTO score to 52% indicating improved disability score per this outcome measure. Baseline: 26% Goal status: INITIAL    LONG TERM GOALS: Target date: 08/23/2022   Pt will report 1-2/10 pain at it's worst, to improve overall tolerance with ADL's, IADL's, and functional mobility. Baseline: can go up to 7/10 Goal status: INITIAL  2.  Pt will improve gross R ankle strength to 4+/5, indicating improved strength for safe balance and gait without AD. Baseline: 3-/5 to 4/5 Goal status: INITIAL  3.  Pt will improve gross R ankle AROM by at least 15deg, indicating improved ankle mobility required for safe and Ind gait. Baseline: 15deg from neutral (DF), 10deg from neutral (PF), 20deg (IV), 10deg (EV) Goal status: INITIAL  4.  Pt will be able to tolerate >5 min of static standing indicating improved safety and tolerance with upright functional mobility/IADL's. Baseline: <20min before needing to move/sit Goal status: INITIAL    PLAN: PT FREQUENCY: 2x/week  PT DURATION: 6 weeks  PLANNED INTERVENTIONS: Therapeutic exercises, Therapeutic activity, Neuromuscular re-education, Balance training, Gait training, Patient/Family education, Self Care, Joint mobilization, Joint manipulation, Stair training, DME instructions, Dry Needling, Electrical stimulation, Cryotherapy, Moist heat, Splintting, Taping, Traction, Ultrasound, Ionotophoresis 4mg /ml Dexamethasone, Manual therapy, and Re-evaluation  PLAN FOR NEXT SESSION: Progress HEP, balance outcome measures, STM/IASTM/Dry Needling, R ankle strengthening/ROM   PT 5:15 PM,07/12/22 Physical Therapist - [facility]

## 2022-07-14 ENCOUNTER — Ambulatory Visit: Payer: BC Managed Care – PPO

## 2022-07-14 DIAGNOSIS — M6281 Muscle weakness (generalized): Secondary | ICD-10-CM | POA: Diagnosis not present

## 2022-07-14 DIAGNOSIS — M25671 Stiffness of right ankle, not elsewhere classified: Secondary | ICD-10-CM

## 2022-07-14 DIAGNOSIS — R262 Difficulty in walking, not elsewhere classified: Secondary | ICD-10-CM | POA: Diagnosis not present

## 2022-07-14 DIAGNOSIS — M25571 Pain in right ankle and joints of right foot: Secondary | ICD-10-CM | POA: Diagnosis not present

## 2022-07-14 NOTE — Therapy (Signed)
OUTPATIENT PHYSICAL THERAPY LOWER EXTREMITY EVALUATION   Patient Name: Olivia Bass MRN: [id number] DOB:08-Dec-1960, 61 y.o., female Today's Date: 07/14/2022   PT End of Session - 07/14/22 1605     Visit Number 4    Number of Visits 30   combined PT/OT/ST   Date for PT Re-Evaluation 08/08/22    Progress Note Due on Visit 10    PT Start Time 1602    PT Stop Time 1635    PT Time Calculation (min) 33 min    Activity Tolerance Patient tolerated treatment well;Patient limited by pain    Behavior During Therapy [facility] for tasks assessed/performed               Past Medical History:  Diagnosis Date   Kidney stones    Metal plates in right arm    montagia   UTI (urinary tract infection)    Past Surgical History:  Procedure Laterality Date   DILATION AND CURETTAGE OF UTERUS     age 59 - miscarriage   metal plate in right arm     TONSILLECTOMY     TONSILLECTOMY     WISDOM TOOTH EXTRACTION     age 89   There are no problems to display for this patient.   PCP: Barbette Reichmann, MD  REFERRING PROVIDER: Gwyneth Revels, DPM  REFERRING DIAG: R ankle pain  THERAPY DIAG:  Pain in right ankle and joints of right foot  Stiffness of right ankle, not elsewhere classified  Rationale for Evaluation and Treatment Rehabilitation  ONSET DATE: 2020  SUBJECTIVE:   SUBJECTIVE STATEMENT: Pt reports she feels like she is making progress as far as ROM and function but states still painful. Reports compliance with current HEP.   PERTINENT HISTORY: Pt reports hx of multiple ankle injuries over the years including her aunt stepping on her feet, twisting ankle on the stairs, and falling. Pt has had multiple x-rays of R ankle with unremarkable results. 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 Deficits include: painful/numb toes (2nd-4th), intermittent episodes of contracted EHL, has to use AD sometimes when she has difficulty with WB, prolonged static standing <5 min, prolonged sitting, staying and falling asleep, increased difficulty with IADL's due to foot pain, repeated rest breaks with IADL's, pain with driving, balance.   PAIN:  Are you having pain? 4/10 pain in R dorsum of foot, constant ache and tingling into toes  PRECAUTIONS: None  WEIGHT BEARING RESTRICTIONS No  FALLS:  Has patient fallen in last 6 months? No  LIVING ENVIRONMENT: Lives with: lives alone Lives in: House/apartment 2-story house; can stay on main level if needed Stairs: Yes: Internal: 8+8 steps; can reach both and External: threshold (front 2"/back 6") steps; none Has following equipment at home: Single point cane, Walker - 2 wheeled, Environmental consultant - 4 wheeled, Wheelchair (manual), shower chair, and Grab bars  OCCUPATION: [employer]; work from home at [employer]; full-time  PLOF: Independent  PATIENT GOALS to be able to walk again without pain; wants to be able to use treadmill   OBJECTIVE:   DIAGNOSTIC FINDINGS: imaging negative for acute fracture of R ankle/foot  PATIENT SURVEYS:  FOTO 26%  COGNITION:  Overall cognitive status: Within functional limits for tasks assessed     SENSATION (RLE):  Light touch intact; allodynia at mortise joint, lateral malleoli, and 2nd-4th digits .  Complaints of tingling and burning of 2nd-4th digits  EDEMA:  +1 non pitting edema of R ankle   POSTURE: No Significant postural limitations  PALPATION: RLE: TTP along lateral malleoli and ATFL. TTP and increased mm tone noted to EHL, EDL, gastroc, and anterior tibialis.  LOWER EXTREMITY ROM:  Active ROM Right eval Left eval  Hip flexion    Hip extension    Hip abduction    Hip adduction    Hip internal rotation    Hip external rotation    Knee flexion    Knee  extension    Ankle dorsiflexion 15deg from resting position, p WFL  Ankle plantarflexion 10deg, p WNL  Ankle inversion 20deg, p WNL  Ankle eversion 10deg, p WNL   (Blank rows = not tested)  LOWER EXTREMITY MMT:  MMT Right eval Left eval  Hip flexion    Hip extension    Hip abduction    Hip adduction    Hip internal rotation    Hip external rotation    Knee flexion    Knee extension    Ankle dorsiflexion 3+ 4+  Ankle plantarflexion 3- 4+  Ankle inversion 4 4+  Ankle eversion 4 4+   (Blank rows = not tested)  LOWER EXTREMITY SPECIAL TESTS:   Attempted anterior drawer (ATFL) and talar tilt testing but unable to get reliable outcome due to increased guarding, despite Jendrasik techniques.  FUNCTIONAL TESTS:  NT today due to time constraint; will benefit from balance assessment in future visits  GAIT: Distance walked: 109ft Assistive device utilized:  using umbrella as SPC Level of assistance: Modified independence Comments: slightly antalgic gait with R stance    TODAY'S TREATMENT:   Manual Therapy: *pain  -PROM to right ankle- DF/PF/IV/EV and extensor hallicus longus. (Within pain limitations) Near Full PROM today x 20 reps each direction -STM to R anterior tibialis, peroneals, calf and EHL - Dorsal/ventral mobs to metatarsals- 2-4- (pain with 2nd/3rd region) x 20 each -PROM to great toe into flex - hold 30 sec x 4 sets.    Therex: Right LE  -R ankle AROM with DF/PF/EV/IV, ankle circles CW/CCW x 10-15 reps each - Toe scrunches on towel x 20  - Toe splay x 10 reps  - With use of golf ball- rolling under great toe to increase flexion of toe x 20 reps      PATIENT EDUCATION:  Education details: pain management techniques, using heat at home, stretching, prognosis, self-care activities daily to reduce stress Person educated: Patient Education method: Explanation, Demonstration, Tactile cues, and Handouts Education comprehension: verbalized  understanding   HOME EXERCISE PROGRAM: Access Code: [email or web address] URL: [email or web address] Date: 06/30/2022 Prepared by: Sande Brothers  Exercises - Toe Yoga - Alternating Great Toe and Lesser Toe Extension  - 1 x daily - 7 x weekly - 3 sets - 10 reps - Arch Lifting  - 1 x daily - 7 x weekly - 3 sets - 10 reps - Toe Spreading  - 1 x daily - 7 x weekly - 3 sets - 10 reps   Access Code: [email or web address] URL: [email or web address] Date: 06/27/2022 Prepared by: Amalia Hailey  Exercises - Seated Ankle Alphabet  - 1 x daily - 7 x weekly - 3 sets - 10 reps - Standing Gastroc Stretch  - 1 x daily - 7 x weekly - 3 sets - 10 reps - Standing Ankle Dorsiflexion Stretch  - 1  x daily - 7 x weekly - 3 sets - 10 reps  ASSESSMENT:  CLINICAL IMPRESSION: Patient presents with improving overall right ankle ROM and much less pain limited to touch. She responded better to manual techniques with less pain overall. She was able to progress therex without increase in right foot pain today. She will benefit from continued skilled OPPT services to address her limited right ankle, pain and difficulty with mobility to improve her overall quality of life, and to improve safety/Ind with functional mobility.   OBJECTIVE IMPAIRMENTS Abnormal gait, decreased activity tolerance, decreased balance, decreased mobility, difficulty walking, decreased ROM, decreased strength, increased edema, increased fascial restrictions, impaired flexibility, impaired sensation, and pain.   ACTIVITY LIMITATIONS sitting, standing, squatting, sleeping, stairs, transfers, bed mobility, dressing, and locomotion level  PARTICIPATION LIMITATIONS: meal prep, cleaning, laundry, driving, shopping, community activity, occupation, and yard work  PERSONAL FACTORS Education, Past/current experiences, Time since onset of injury/illness/exacerbation, and 3+ comorbidities: HTN, obesity, general sensory neuropathy  are also  affecting patient's functional outcome.   REHAB POTENTIAL: Good  CLINICAL DECISION MAKING: Stable/uncomplicated  EVALUATION COMPLEXITY: Moderate   GOALS: Goals reviewed with patient? Yes  SHORT TERM GOALS: Target date: 08/04/2022  Pt will improve FOTO score to 52% indicating improved disability score per this outcome measure. Baseline: 26% Goal status: INITIAL    LONG TERM GOALS: Target date: 08/25/2022   Pt will report 1-2/10 pain at it's worst, to improve overall tolerance with ADL's, IADL's, and functional mobility. Baseline: can go up to 7/10 Goal status: INITIAL  2.  Pt will improve gross R ankle strength to 4+/5, indicating improved strength for safe balance and gait without AD. Baseline: 3-/5 to 4/5 Goal status: INITIAL  3.  Pt will improve gross R ankle AROM by at least 15deg, indicating improved ankle mobility required for safe and Ind gait. Baseline: 15deg from neutral (DF), 10deg from neutral (PF), 20deg (IV), 10deg (EV) Goal status: INITIAL  4.  Pt will be able to tolerate >5 min of static standing indicating improved safety and tolerance with upright functional mobility/IADL's. Baseline: <53min before needing to move/sit Goal status: INITIAL    PLAN: PT FREQUENCY: 2x/week  PT DURATION: 6 weeks  PLANNED INTERVENTIONS: Therapeutic exercises, Therapeutic activity, Neuromuscular re-education, Balance training, Gait training, Patient/Family education, Self Care, Joint mobilization, Joint manipulation, Stair training, DME instructions, Dry Needling, Electrical stimulation, Cryotherapy, Moist heat, Splintting, Taping, Traction, Ultrasound, Ionotophoresis 4mg /ml Dexamethasone, Manual therapy, and Re-evaluation  PLAN FOR NEXT SESSION: Progress HEP, balance outcome measures, STM/IASTM/Dry Needling, R ankle strengthening/ROM   PT 4:38 PM,07/14/22 Physical Therapist - [facility]

## 2022-07-16 DIAGNOSIS — H10023 Other mucopurulent conjunctivitis, bilateral: Secondary | ICD-10-CM | POA: Diagnosis not present

## 2022-07-20 ENCOUNTER — Ambulatory Visit: Payer: BC Managed Care – PPO | Admitting: Physical Therapy

## 2022-07-20 DIAGNOSIS — M6281 Muscle weakness (generalized): Secondary | ICD-10-CM | POA: Diagnosis not present

## 2022-07-20 DIAGNOSIS — M25671 Stiffness of right ankle, not elsewhere classified: Secondary | ICD-10-CM

## 2022-07-20 DIAGNOSIS — R262 Difficulty in walking, not elsewhere classified: Secondary | ICD-10-CM | POA: Diagnosis not present

## 2022-07-20 DIAGNOSIS — M25571 Pain in right ankle and joints of right foot: Secondary | ICD-10-CM | POA: Diagnosis not present

## 2022-07-20 NOTE — Therapy (Signed)
OUTPATIENT PHYSICAL THERAPY LOWER EXTREMITY EVALUATION   Patient Name: Olivia Bass MRN: [id number] DOB:June 16, 1961, 61 y.o., female Today's Date: 07/20/2022   PT End of Session - 07/20/22 1444     Visit Number 5    Number of Visits 30   combined PT/OT/ST   Date for PT Re-Evaluation 08/08/22    Progress Note Due on Visit 10    PT Start Time 0317    PT Stop Time 0353    PT Time Calculation (min) 36 min    Activity Tolerance Patient tolerated treatment well;Patient limited by pain    Behavior During Therapy [facility] for tasks assessed/performed                Past Medical History:  Diagnosis Date   Kidney stones    Metal plates in right arm    montagia   UTI (urinary tract infection)    Past Surgical History:  Procedure Laterality Date   DILATION AND CURETTAGE OF UTERUS     age 16 - miscarriage   metal plate in right arm     TONSILLECTOMY     TONSILLECTOMY     WISDOM TOOTH EXTRACTION     age 43   There are no problems to display for this patient.   PCP: Tracie Harrier, MD  REFERRING PROVIDER: Samara Deist, DPM  REFERRING DIAG: R ankle pain  THERAPY DIAG:  Pain in right ankle and joints of right foot  Stiffness of right ankle, not elsewhere classified  Rationale for Evaluation and Treatment Rehabilitation  ONSET DATE: 2020  SUBJECTIVE:   SUBJECTIVE STATEMENT: Pt reports she has been been doing exercises a lot and she has also been using massage gun a lot. Has been noticing improvements in her pain and function.  PERTINENT HISTORY: Pt reports hx of multiple ankle injuries over the years including her aunt stepping on her feet, twisting ankle on the stairs, and falling. Pt has had multiple x-rays of R ankle with unremarkable results. 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 Pt reports that she recently underwent a nerve conduction study and was diagnosed with general  sensory neuropathy throughout her whole body. Deficits include: painful/numb toes (2nd-4th), intermittent episodes of contracted EHL, has to use AD sometimes when she has difficulty with WB, prolonged static standing <5 min, prolonged sitting, staying and falling asleep, increased difficulty with IADL's due to foot pain, repeated rest breaks with IADL's, pain with driving, balance.   PAIN:  Are you having pain? 4/10 pain in R dorsum of foot, constant ache and tingling into toes  PRECAUTIONS: None  WEIGHT BEARING RESTRICTIONS No  FALLS:  Has patient fallen in last 6 months? 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  Complaints of tingling and burning of 2nd-4th digits  EDEMA:  +1 non pitting edema of R ankle   POSTURE: No Significant postural limitations  PALPATION: RLE: TTP along lateral malleoli and ATFL. TTP and increased mm tone noted to EHL, EDL, gastroc, and anterior tibialis.  LOWER EXTREMITY ROM:  Active ROM Right eval Left eval  Hip flexion    Hip extension    Hip abduction    Hip adduction    Hip internal rotation    Hip external rotation    Knee  flexion    Knee extension    Ankle dorsiflexion 15deg from resting position, p WFL  Ankle plantarflexion 10deg, p WNL  Ankle inversion 20deg, p WNL  Ankle eversion 10deg, p WNL   (Blank rows = not tested)  LOWER EXTREMITY MMT:  MMT Right eval Left eval  Hip flexion    Hip extension    Hip abduction    Hip adduction    Hip internal rotation    Hip external rotation    Knee flexion    Knee extension    Ankle dorsiflexion 3+ 4+  Ankle plantarflexion 3- 4+  Ankle inversion 4 4+  Ankle eversion 4 4+   (Blank rows = not tested)  LOWER EXTREMITY SPECIAL TESTS:   Attempted anterior drawer (ATFL) and talar tilt testing but unable to get reliable outcome due to increased guarding, despite Jendrasik techniques.  FUNCTIONAL TESTS:  NT today due to time constraint; will benefit from balance assessment in future visits  GAIT: Distance walked: 3ft Assistive device utilized:  using umbrella as SPC Level of assistance: Modified independence Comments: slightly antalgic gait with R stance    TODAY'S TREATMENT:   Manual Therapy:  -PROM to right ankle- DF/PF/IV/EV and extensor hallicus longus. (Within pain limitations) Near Full PROM today x 20 to 30-second hold each direction.  Patient has difficulty with allowing passive range of motion and relaxing lower extremity muscle groups. -STM and IASTM to R anterior tibialis, plantar fascia, peroneals, calf and EHL - Dorsal/ventral mobs to metatarsals- 2-4- (pain with 2nd/3rd region) x 20 each -PROM to great toe into flex - hold 30 sec x 4 sets.  -Ice to ankle and foot region for 6 minutes at end of session, no charge associated with this  -To improve pain post therapeutic interventions.       PATIENT EDUCATION:  Education details: pain management techniques, using heat at home, stretching, prognosis, self-care activities daily to reduce stress Person educated: Patient Education method: Explanation, Demonstration, Tactile cues, and  Handouts Education comprehension: verbalized understanding   HOME EXERCISE PROGRAM: Access Code: [email or web address] URL: [email or web address] Date: 06/30/2022 Prepared by: Maureen Ralphs  Exercises - Toe Yoga - Alternating Great Toe and Lesser Toe Extension  - 1 x daily - 7 x weekly - 3 sets - 10 reps - Arch Lifting  - 1 x daily - 7 x weekly - 3 sets - 10 reps - Toe Spreading  - 1 x daily - 7 x weekly - 3 sets - 10 reps   Access Code: [email or web address] URL: [email or web address] Date: 06/27/2022 Prepared by: Carlena Hurl  Exercises - Seated Ankle Alphabet  - 1 x daily - 7 x weekly - 3 sets - 10 reps - Standing Gastroc Stretch  - 1 x daily - 7 x weekly - 3 sets - 10 reps - Standing Ankle Dorsiflexion Stretch  - 1 x daily -  7 x weekly - 3 sets - 10 reps  ASSESSMENT:  CLINICAL IMPRESSION: Patient presents with improving overall right ankle ROM and much less pain limited to touch. She responded better to manual techniques with less pain overall.  Patient responded well to ice at end of session and presents with less pain post session compared to previously but still in increased pain at end of session.  Patient working diligently on home exercises and is completing them many times throughout the day.  We will continue to progress these as indicated. Pt will continue to benefit from skilled physical therapy intervention to address impairments, improve QOL, and attain therapy goals.     OBJECTIVE IMPAIRMENTS Abnormal gait, decreased activity tolerance, decreased balance, decreased mobility, difficulty walking, decreased ROM, decreased strength, increased edema, increased fascial restrictions, impaired flexibility, impaired sensation, and pain.   ACTIVITY LIMITATIONS sitting, standing, squatting, sleeping, stairs, transfers, bed mobility, dressing, and locomotion level  PARTICIPATION LIMITATIONS: meal prep, cleaning, laundry, driving, shopping, community activity,  occupation, and yard work  PERSONAL FACTORS Education, Past/current experiences, Time since onset of injury/illness/exacerbation, and 3+ comorbidities: HTN, obesity, general sensory neuropathy  are also affecting patient's functional outcome.   REHAB POTENTIAL: Good  CLINICAL DECISION MAKING: Stable/uncomplicated  EVALUATION COMPLEXITY: Moderate   GOALS: Goals reviewed with patient? Yes  SHORT TERM GOALS: Target date: 08/10/2022  Pt will improve FOTO score to 52% indicating improved disability score per this outcome measure. Baseline: 26% Goal status: INITIAL    LONG TERM GOALS: Target date: 08/31/2022   Pt will report 1-2/10 pain at it's worst, to improve overall tolerance with ADL's, IADL's, and functional mobility. Baseline: can go up to 7/10 Goal status: INITIAL  2.  Pt will improve gross R ankle strength to 4+/5, indicating improved strength for safe balance and gait without AD. Baseline: 3-/5 to 4/5 Goal status: INITIAL  3.  Pt will improve gross R ankle AROM by at least 15deg, indicating improved ankle mobility required for safe and Ind gait. Baseline: 15deg from neutral (DF), 10deg from neutral (PF), 20deg (IV), 10deg (EV) Goal status: INITIAL  4.  Pt will be able to tolerate >5 min of static standing indicating improved safety and tolerance with upright functional mobility/IADL's. Baseline: <73min before needing to move/sit Goal status: INITIAL    PLAN: PT FREQUENCY: 2x/week  PT DURATION: 6 weeks  PLANNED INTERVENTIONS: Therapeutic exercises, Therapeutic activity, Neuromuscular re-education, Balance training, Gait training, Patient/Family education, Self Care, Joint mobilization, Joint manipulation, Stair training, DME instructions, Dry Needling, Electrical stimulation, Cryotherapy, Moist heat, Splintting, Taping, Traction, Ultrasound, Ionotophoresis 4mg /ml Dexamethasone, Manual therapy, and Re-evaluation  PLAN FOR NEXT SESSION: Progress HEP, balance outcome  measures, STM/IASTM/Dry Needling, R ankle strengthening/ROM   PT 5:00 PM,07/20/22 Physical Therapist - [place] [facility]

## 2022-07-22 ENCOUNTER — Ambulatory Visit: Payer: BC Managed Care – PPO | Admitting: Physical Therapy

## 2022-07-25 ENCOUNTER — Ambulatory Visit: Payer: BC Managed Care – PPO

## 2022-07-25 DIAGNOSIS — M25571 Pain in right ankle and joints of right foot: Secondary | ICD-10-CM

## 2022-07-25 DIAGNOSIS — M25671 Stiffness of right ankle, not elsewhere classified: Secondary | ICD-10-CM

## 2022-07-25 DIAGNOSIS — R262 Difficulty in walking, not elsewhere classified: Secondary | ICD-10-CM | POA: Diagnosis not present

## 2022-07-25 DIAGNOSIS — M6281 Muscle weakness (generalized): Secondary | ICD-10-CM

## 2022-07-25 NOTE — Therapy (Signed)
OUTPATIENT PHYSICAL THERAPY LOWER EXTREMITY TREATMENT NOTE   Patient Name: Olivia Bass MRN: [id number] DOB:1961/06/10, 61 y.o., female Today's Date: 07/25/2022   PT End of Session - 07/25/22 0959     Visit Number 6    Number of Visits 30   combined PT/OT/ST   Date for PT Re-Evaluation 08/08/22    Progress Note Due on Visit 10    PT Start Time 0856    PT Stop Time 0940    PT Time Calculation (min) 44 min    Activity Tolerance Patient tolerated treatment well;Patient limited by pain    Behavior During Therapy [facility] for tasks assessed/performed                Past Medical History:  Diagnosis Date   Kidney stones    Metal plates in right arm    montagia   UTI (urinary tract infection)    Past Surgical History:  Procedure Laterality Date   DILATION AND CURETTAGE OF UTERUS     age 12 - miscarriage   metal plate in right arm     TONSILLECTOMY     TONSILLECTOMY     WISDOM TOOTH EXTRACTION     age 40   There are no problems to display for this patient.   PCP: Tracie Harrier, MD  REFERRING PROVIDER: Samara Deist, DPM  REFERRING DIAG: R ankle pain  THERAPY DIAG:  Pain in right ankle and joints of right foot  Stiffness of right ankle, not elsewhere classified  Muscle weakness (generalized)  Difficulty in walking, not elsewhere classified  Rationale for Evaluation and Treatment Rehabilitation  ONSET DATE: 2020  SUBJECTIVE:   SUBJECTIVE STATEMENT: Pt reports she feels her ankle is slowly improving. She reports 3/10 pain currently mainly in her toes. She reports no stumbles/falls. She is using SPC today.   PAIN:  Are you having pain? 3/10 R toes  PERTINENT HISTORY: Pt reports hx of multiple ankle injuries over the years including her aunt stepping on her feet, twisting ankle on the stairs, and falling. Pt has had multiple x-rays of R ankle with unremarkable results. 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 Deficits include: painful/numb toes (2nd-4th), intermittent episodes of contracted EHL, has to use AD sometimes when she has difficulty with WB, prolonged static standing <5 min, prolonged sitting, staying and falling asleep, increased difficulty with IADL's due to foot pain, repeated rest breaks with IADL's, pain with driving, balance.   PAIN:  Are you having pain? 4/10 pain in R dorsum of foot, constant ache and tingling into toes  PRECAUTIONS: None  WEIGHT BEARING RESTRICTIONS No  FALLS:  Has patient fallen in last 6 months? 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  Complaints of tingling and burning of 2nd-4th digits  EDEMA:  +1 non pitting edema of R ankle   POSTURE: No Significant postural limitations  PALPATION: RLE: TTP along lateral malleoli and ATFL. TTP and increased mm tone noted to EHL, EDL, gastroc, and anterior tibialis.  LOWER EXTREMITY ROM:  Active ROM Right eval Left eval   Hip flexion    Hip extension    Hip abduction    Hip adduction    Hip internal rotation    Hip external rotation    Knee flexion    Knee extension    Ankle dorsiflexion 15deg from resting position, p WFL  Ankle plantarflexion 10deg, p WNL  Ankle inversion 20deg, p WNL  Ankle eversion 10deg, p WNL   (Blank rows = not tested)  LOWER EXTREMITY MMT:  MMT Right eval Left eval  Hip flexion    Hip extension    Hip abduction    Hip adduction    Hip internal rotation    Hip external rotation    Knee flexion    Knee extension    Ankle dorsiflexion 3+ 4+  Ankle plantarflexion 3- 4+  Ankle inversion 4 4+  Ankle eversion 4 4+   (Blank rows = not tested)  LOWER EXTREMITY SPECIAL TESTS:   Attempted anterior drawer (ATFL) and talar tilt testing but unable to get reliable outcome due to increased guarding, despite Jendrasik techniques.  FUNCTIONAL TESTS:  NT today due to time constraint; will benefit from balance assessment in future visits  GAIT: Distance walked: 15ft Assistive device utilized:  using umbrella as SPC Level of assistance: Modified independence Comments: slightly antalgic gait with R stance    TODAY'S TREATMENT:   Manual Therapy:   Modified positioning on plinth with bolster support under knees and pillow support under R ankle to maximize comfort.   Elastogel donned to R ankle while the following interventions are performed:  PROM  R LE digits ext/flex modified to pain-limited range x multiple reps. PT did have to adjust range throughout due to pain.     Attempted R DF stretch x 15 sec, too pain-limited to continue  - Dorsal/ventral mobs to metatarsals- 1-4- x 15-20 each  -R TC joint mobilization gradie I-II 2x30 sec  -Digits 4-5 PA grade I-II mobilization 30 sec each  Therex: Right LE Seated:  Toe towel crunches x 3 min R ankle pump 2x20 R Alphabet x 1 round R Toe splays (PT providing intermittent assist) x multiple reps. Pt reports improved  control of movement today.  Isometrics: DF/PF/EV/IV 10 reps of each  -R ankle AROM with DF/PF/EV/IV 10x  ankle circles CW/CCW x 10 reps each    Pt reports to PT during session history of frequent dislocations in he B fingers and elbows for years along with hypermobility throughout body, where many areas are now pain-limited. PT encourages pt to follow-up with physician regarding  this information since she reports she has not shared this info with her doctors.   Elastogel removed at end of session. No adverse reaction to treatment. Pt reports intervention helped pain.   PATIENT EDUCATION:  Education details: pain management techniques, using ice at home, exercise technique, body mechanics Person educated: Patient Education method: Explanation, Demonstration, Tactile cues, and Verbal cues Education  comprehension: verbalized understanding, returned demonstration, verbal cues required, and needs further education   HOME EXERCISE PROGRAM: No updates today, pt to continue HEP as previously given Access Code: [email or web address] URL: [email or web address] Date: 06/30/2022 Prepared by: Sande Brothers  Exercises - Toe Yoga - Alternating Great Toe and Lesser Toe Extension  - 1 x daily - 7 x weekly - 3 sets - 10 reps - Arch Lifting  - 1 x daily - 7 x weekly - 3 sets - 10 reps - Toe Spreading  - 1 x daily - 7 x weekly - 3 sets - 10 reps   Access Code: [email or web address] URL: [email or web address] Date: 06/27/2022 Prepared by: Amalia Hailey  Exercises - Seated Ankle Alphabet  - 1 x daily - 7 x weekly - 3 sets - 10 reps - Standing Gastroc Stretch  - 1 x daily - 7 x weekly - 3 sets - 10 reps - Standing Ankle Dorsiflexion Stretch  - 1 x daily - 7 x weekly - 3 sets - 10 reps  ASSESSMENT:  CLINICAL IMPRESSION: Author continued plan of care as laid out in evaluation and previous sessions. Pt presents with excellent motivation to participate in PT, however, is more pain-limited  today. PT focused primarily on gentle mobility exercises and had pt modify range/intensity throughout to improve comfort and tolerance with interventions. PT did instruct pt to follow-up with her physician regarding reported hx of frequent subluxation/dislocations of BUE digits and elbows. Pt verbalized understanding. She will benefit from continued skilled OPPT services to address her limited right ankle, pain and difficulty with mobility to improve her overall quality of life, and to improve safety/Ind with functional mobility.   OBJECTIVE IMPAIRMENTS Abnormal gait, decreased activity tolerance, decreased balance, decreased mobility, difficulty walking, decreased ROM, decreased strength, increased edema, increased fascial restrictions, impaired flexibility, impaired sensation, and pain.   ACTIVITY LIMITATIONS sitting, standing, squatting, sleeping, stairs, transfers, bed mobility, dressing, and locomotion level  PARTICIPATION LIMITATIONS: meal prep, cleaning, laundry, driving, shopping, community activity, occupation, and yard work  PERSONAL FACTORS Education, Past/current experiences, Time since onset of injury/illness/exacerbation, and 3+ comorbidities: HTN, obesity, general sensory neuropathy  are also affecting patient's functional outcome.   REHAB POTENTIAL: Good  CLINICAL DECISION MAKING: Stable/uncomplicated  EVALUATION COMPLEXITY: Moderate   GOALS: Goals reviewed with patient? Yes  SHORT TERM GOALS: Target date: 08/15/2022  Pt will improve FOTO score to 52% indicating improved disability score per this outcome measure. Baseline: 26% Goal status: INITIAL    LONG TERM GOALS: Target date: 09/05/2022   Pt will report 1-2/10 pain at it's worst, to improve overall tolerance with ADL's, IADL's, and functional mobility. Baseline: can go up to 7/10 Goal status: INITIAL  2.  Pt will improve gross R ankle strength to 4+/5, indicating improved strength for safe balance and gait without  AD. Baseline: 3-/5 to 4/5 Goal status: INITIAL  3.  Pt will improve gross R ankle AROM by at least 15deg, indicating improved ankle mobility required for safe and Ind gait. Baseline: 15deg from neutral (DF), 10deg from neutral (PF), 20deg (IV), 10deg (EV) Goal status: INITIAL  4.  Pt will be able to tolerate >5 min of static standing indicating improved safety and tolerance with upright functional mobility/IADL's. Baseline: <17min before needing to move/sit Goal status: INITIAL    PLAN: PT FREQUENCY: 2x/week  PT DURATION: 6 weeks  PLANNED INTERVENTIONS: Therapeutic exercises, Therapeutic activity, Neuromuscular re-education, Balance training, Gait training, Patient/Family education, Self Care, Joint mobilization, Joint manipulation, Stair training, DME instructions, Dry Needling,  Electrical stimulation, Cryotherapy, Moist heat, Splintting, Taping, Traction, Ultrasound, Ionotophoresis 4mg /ml Dexamethasone, Manual therapy, and Re-evaluation  PLAN FOR NEXT SESSION: Progress HEP, balance outcome measures, STM/IASTM/Dry Needling, R ankle strengthening/ROM, continue plan   Zollie Pee PT 10:22 AM,07/25/22 Physical Therapist - [place] [facility] [facility] SERVICES [address] [place], [place], [address] Phone: [phone]   Fax:  [phone]  Patient Details  Name: LUZ REITMEIER MRN: [id number] Date of Birth: 01/21/1961 Referring Provider:  Samara Deist, DPM  Encounter Date: 07/25/2022   Zollie Pee, PT 07/25/2022, 10:22 AM  [place] MAIN [facility] SERVICES [address] [place], [place], [address] Phone: [phone]   Fax:  [phone]

## 2022-07-26 ENCOUNTER — Ambulatory Visit: Payer: BC Managed Care – PPO | Admitting: Physical Therapy

## 2022-07-29 ENCOUNTER — Encounter: Payer: BC Managed Care – PPO | Admitting: Physical Therapy

## 2022-08-01 ENCOUNTER — Ambulatory Visit: Payer: BC Managed Care – PPO | Attending: Podiatry

## 2022-08-01 DIAGNOSIS — M25571 Pain in right ankle and joints of right foot: Secondary | ICD-10-CM | POA: Diagnosis not present

## 2022-08-01 DIAGNOSIS — R262 Difficulty in walking, not elsewhere classified: Secondary | ICD-10-CM | POA: Insufficient documentation

## 2022-08-01 DIAGNOSIS — M6281 Muscle weakness (generalized): Secondary | ICD-10-CM | POA: Diagnosis not present

## 2022-08-01 DIAGNOSIS — M25671 Stiffness of right ankle, not elsewhere classified: Secondary | ICD-10-CM | POA: Insufficient documentation

## 2022-08-01 NOTE — Therapy (Signed)
OUTPATIENT PHYSICAL THERAPY LOWER EXTREMITY TREATMENT NOTE   Patient Name: Olivia Bass MRN: [id number] DOB:[birth year], 61 y.o., female Today's Date: 08/01/2022   PT End of Session - 08/01/22 1358     Visit Number 7    Number of Visits 30    Date for PT Re-Evaluation 08/08/22    Authorization Type [employer] COMM Pro    Authorization Time Period 06/27/22-08/08/22    Authorization - Visit Number 7    Authorization - Number of Visits 30    Progress Note Due on Visit 10    PT Start Time 4098    PT Stop Time 1423    PT Time Calculation (min) 30 min    Activity Tolerance Patient tolerated treatment well;Patient limited by pain    Behavior During Therapy [facility] for tasks assessed/performed;Anxious                Past Medical History:  Diagnosis Date   Kidney stones    Metal plates in right arm    montagia   UTI (urinary tract infection)    Past Surgical History:  Procedure Laterality Date   DILATION AND CURETTAGE OF UTERUS     age 66 - miscarriage   metal plate in right arm     TONSILLECTOMY     TONSILLECTOMY     WISDOM TOOTH EXTRACTION     age 46   There are no problems to display for this patient.   PCP: Olivia Harrier, MD  REFERRING PROVIDER: Samara Bass, DPM  REFERRING DIAG: R ankle pain  THERAPY DIAG:  Pain in right ankle and joints of right foot  Stiffness of right ankle, not elsewhere classified  Muscle weakness (generalized)  Difficulty in walking, not elsewhere classified  Rationale for Evaluation and Treatment Rehabilitation  ONSET DATE: 2020  SUBJECTIVE:   SUBJECTIVE STATEMENT: Pt "overdidit" this weekend moving a hoyer lift and hospital bed out of the house, had a dramatic increase in pain thereafter which is somewhat better today.   PAIN:  Are you having pain? 3/10 R toes  PERTINENT HISTORY: Olivia Bass is a 36yoF who is referred to [facility] PT from podiatry for chronic dispersed Rt foot pain. Per podiatry notes, pain is quite dispersed,  uncharacteristic of a single orthopedic problem, imaging has been reassuring. Podiatry wanted to see if PT could help with pain issues, but feel  that further workup with Rheumatology may be warranted. Pt followed by neurology since 2021 for paresthesias, NCS revealing of primary sensory neuropathy, pt not interested in trial of Cymbalta at that time due to concerns of side effects impacting her caregiver duties.   Per Dr. Vickki Muff: "Her pain is very diffuse in nature. Not consistent with any particular or specific concern. She does have calf pain and Achilles tendinitis with sinus tarsi type pain. Some mild tarsal tunnel syndrome as well as forefoot metatarsalgia. I did discuss the possibility of a neurological component. This could also have a rheumatologic component. Chart review reveals an ESR of 11. No other labs have been performed from a rheumatology standpoint. I am going to refer her to physical therapy. If she is not improved we will defer back to primary care for possible rheumatoid work-up versus referral to neurology for neurological work-up."  Per pt report: hx of multiple ankle injuries over the years including her aunt stepping on her feet, twisting ankle on the stairs, and falling. Imaging studies unremarkable. Good short term response to oral steroid courses, but increased pain with remote  Morton's neuroma injection. CC include pain/numbness in Left toes (2nd-4th), uses a SPC intermittently for antalgia; pain impacts pt's ability to stand, sit, fall asleep, stay asleep, drive, and maintain balance, as well as many other IADL.  PAIN:  Are you having pain? 3/10 pain in R dorsum of foot, constant ache and tingling into toes  PRECAUTIONS: None  WEIGHT BEARING RESTRICTIONS No  FALLS:  Has patient fallen in last 6 months? No  OCCUPATION: [employer]; work from home at [employer]; full-time  PLOF: Independent  PATIENT GOALS to be able to walk again without pain; wants to be able  to use treadmill     TODAY'S TREATMENT: -seated RLE ankle foot slides x20 -foot slides stretching DF 2x30sec -ankle circles 20x CW, 20x CCW  -seated flat foot rotational heel slides (knee at 90 degrees) x15 (cues to keep ROM within tolerated limits) this is where pain gets provoked -seated ankle alphabet halfway, unable to complete per pain     PATIENT EDUCATION:  Education details: pain management techniques, using ice at home, exercise technique, body mechanics Person educated: Patient Education method: Explanation, Demonstration, Tactile cues, and Verbal cues Education comprehension: verbalized understanding, returned demonstration, verbal cues required, and needs further education   HOME EXERCISE PROGRAM: No updates today, pt to continue HEP as previously given Access Code: [email or web address] URL: [email or web address] Date: 06/30/2022 Prepared by: Sande Brothers  Exercises - Toe Yoga - Alternating Great Toe and Lesser Toe Extension  - 1 x daily - 7 x weekly - 3 sets - 10 reps - Arch Lifting  - 1 x daily - 7 x weekly - 3 sets - 10 reps - Toe Spreading  - 1 x daily - 7 x weekly - 3 sets - 10 reps   Access Code: [email or web address] URL: [email or web address] Date: 06/27/2022 Prepared by: Amalia Hailey  Exercises - Seated Ankle Alphabet  - 1 x daily - 7 x weekly - 3 sets - 10 reps - Standing Gastroc Stretch  - 1 x daily - 7 x weekly - 3 sets - 10 reps - Standing Ankle Dorsiflexion Stretch  - 1 x daily - 7 x weekly - 3 sets - 10 reps  ASSESSMENT:  CLINICAL IMPRESSION: Session limited by acutely exacerbated pain which responds poorly to some interventions today, pain quickly elevating from 3/10 to 6-7/10 and pt in grimace. Unable to tolerate interventions beyond this, session ended a little early to prioritize recovery. Pt reminded multiple times to pace self at home more appropriately and to not add in exercises to HEP taken from each session here. Pt is  reluctant to end session early, but agreeable. ROM in talocrural and subtalar joints looking closer to WNL. She will benefit from continued skilled OPPT services to address her limited right ankle, pain and difficulty with mobility to improve her overall quality of life, and to improve safety/Ind with functional mobility.   OBJECTIVE IMPAIRMENTS Abnormal gait, decreased activity tolerance, decreased balance, decreased mobility, difficulty walking, decreased ROM, decreased strength, increased edema, increased fascial restrictions, impaired flexibility, impaired sensation, and pain.   ACTIVITY LIMITATIONS sitting, standing, squatting, sleeping, stairs, transfers, bed mobility, dressing, and locomotion level  PARTICIPATION LIMITATIONS: meal prep, cleaning, laundry, driving, shopping, community activity, occupation, and yard work  PERSONAL FACTORS Education, Past/current experiences, Time since onset of injury/illness/exacerbation, and 3+ comorbidities: HTN, obesity, general sensory neuropathy  are also affecting patient's functional outcome.   REHAB POTENTIAL: Good  CLINICAL DECISION MAKING: Stable/uncomplicated  EVALUATION COMPLEXITY: Moderate   GOALS: Goals  reviewed with patient? Yes  SHORT TERM GOALS: Target date: 08/22/2022  Pt will improve FOTO score to 52% indicating improved disability score per this outcome measure. Baseline: 26% Goal status: INITIAL    LONG TERM GOALS: Target date: 09/12/2022   Pt will report 1-2/10 pain at it's worst, to improve overall tolerance with ADL's, IADL's, and functional mobility. Baseline: can go up to 7/10 Goal status: INITIAL  2.  Pt will improve gross R ankle strength to 4+/5, indicating improved strength for safe balance and gait without AD. Baseline: 3-/5 to 4/5 Goal status: INITIAL  3.  Pt will improve gross R ankle AROM by at least 15deg, indicating improved ankle mobility required for safe and Ind gait. Baseline: 15deg from neutral  (DF), 10deg from neutral (PF), 20deg (IV), 10deg (EV) Goal status: INITIAL  4.  Pt will be able to tolerate >5 min of static standing indicating improved safety and tolerance with upright functional mobility/IADL's. Baseline: <69mn before needing to move/sit Goal status: INITIAL    PLAN: PT FREQUENCY: 2x/week  PT DURATION: 6 weeks  PLANNED INTERVENTIONS: Therapeutic exercises, Therapeutic activity, Neuromuscular re-education, Balance training, Gait training, Patient/Family education, Self Care, Joint mobilization, Joint manipulation, Stair training, DME instructions, Dry Needling, Electrical stimulation, Cryotherapy, Moist heat, Splintting, Taping, Traction, Ultrasound, Ionotophoresis 447mml Dexamethasone, Manual therapy, and Re-evaluation  PLAN FOR NEXT SESSION: review original HEP and assure appropriate performance pacing, attempt strength interventions without pain provocation. Reassessment and decide whether to refer back to podiatry or continue with PT. Pt has a 30VL annually.     Patient Details  Name: CaDAVETTA OLLIFFRN: [id number] of Birth: 1/Oct 18, 1962eferring Provider:  FoSamara DeistDPM  Encounter Date: 08/01/2022  3:09 PM, 08/01/22 AlEtta GrandchildPT, DPT Physical Therapist - [facility]- [phone]   BuEvaroPTVirginia0/12/2021, 2:28 PM  [facility] [facility] REChrist[facility]ossierERVICES [address]dCade LakesNCAlaska[address]hone: [phone] Fax:  [phone]

## 2022-08-05 ENCOUNTER — Ambulatory Visit: Payer: BC Managed Care – PPO | Admitting: Physical Therapy

## 2022-08-08 NOTE — Therapy (Signed)
OUTPATIENT PHYSICAL THERAPY LOWER EXTREMITY TREATMENT NOTE/RECERT?   Patient Name: Olivia Bass MRN: [id number] DOB:[birth year], 61 y.o., female Today's Date: 08/08/2022        Past Medical History:  Diagnosis Date   Kidney stones    Metal plates in right arm    montagia   UTI (urinary tract infection)    Past Surgical History:  Procedure Laterality Date   DILATION AND CURETTAGE OF UTERUS     age 65 - miscarriage   metal plate in right arm     TONSILLECTOMY     TONSILLECTOMY     WISDOM TOOTH EXTRACTION     age 77   There are no problems to display for this patient.   PCP: Tracie Harrier, MD  REFERRING PROVIDER: Samara Deist, DPM  REFERRING DIAG: R ankle pain  THERAPY DIAG:  No diagnosis found.  Rationale for Evaluation and Treatment Rehabilitation  ONSET DATE: 2020  SUBJECTIVE:   SUBJECTIVE STATEMENT: ***  PAIN:  Are you having pain? 3/10 R toes  PERTINENT HISTORY: Olivia Bass is a 23yoF who is referred to [facility] PT from podiatry for chronic dispersed Rt foot pain. Per podiatry notes, pain is quite dispersed, uncharacteristic of a single orthopedic problem, imaging has been reassuring. Podiatry wanted to see if PT could help with pain issues, but feel  that further workup with Rheumatology may be warranted. Pt followed by neurology since 2021 for paresthesias, NCS revealing of primary sensory neuropathy, pt not interested in trial of Cymbalta at that time due to concerns of side effects impacting her caregiver duties.   Per Dr. Vickki Muff: "Her pain is very diffuse in nature. Not consistent with any particular or specific concern. She does have calf pain and Achilles tendinitis with sinus tarsi type pain. Some mild tarsal tunnel syndrome as well as forefoot metatarsalgia. I did discuss the possibility of a neurological component. This could also have a rheumatologic component. Chart review reveals an ESR of 11. No other labs have been performed from a  rheumatology standpoint. I am going to refer her to physical therapy. If she is not improved we will defer back to primary care for possible rheumatoid work-up versus referral to neurology for neurological work-up."  Per pt report: hx of multiple ankle injuries over the years including her aunt stepping on her feet, twisting ankle on the stairs, and falling. Imaging studies unremarkable. Good short term response to oral steroid courses, but increased pain with remote Morton's neuroma injection. CC include pain/numbness in Left toes (2nd-4th), uses a SPC intermittently for antalgia; pain impacts pt's ability to stand, sit, fall asleep, stay asleep, drive, and maintain balance, as well as many other IADL.  PAIN:  Are you having pain? 3/10 pain in R dorsum of foot, constant ache and tingling into toes  PRECAUTIONS: None  WEIGHT BEARING RESTRICTIONS No  FALLS:  Has patient fallen in last 6 months? No  OCCUPATION: [employer]; work from home at [employer]; full-time  PLOF: Independent  PATIENT GOALS to be able to walk again without pain; wants to be able to use treadmill     TODAY'S TREATMENT: Goals: VAS MMT ankle ROM Static stand 5 minutes FOTO   -seated RLE ankle foot slides x20 -foot slides stretching DF 2x30sec -ankle circles 20x CW, 20x CCW  -seated flat foot rotational heel slides (knee at 90 degrees) x15 (cues to keep ROM within tolerated limits) this is where pain gets provoked -seated ankle alphabet halfway, unable to complete per  pain     PATIENT EDUCATION:  Education details: pain management techniques, using ice at home, exercise technique, body mechanics Person educated: Patient Education method: Explanation, Demonstration, Tactile cues, and Verbal cues Education comprehension: verbalized understanding, returned demonstration, verbal cues required, and needs further education   HOME EXERCISE PROGRAM: No updates today, pt to continue HEP as previously  given Access Code: [email or web address] URL: [email or web address] Date: 06/30/2022 Prepared by: Sande Brothers  Exercises - Toe Yoga - Alternating Great Toe and Lesser Toe Extension  - 1 x daily - 7 x weekly - 3 sets - 10 reps - Arch Lifting  - 1 x daily - 7 x weekly - 3 sets - 10 reps - Toe Spreading  - 1 x daily - 7 x weekly - 3 sets - 10 reps   Access Code: [email or web address] URL: [email or web address] Date: 06/27/2022 Prepared by: Amalia Hailey  Exercises - Seated Ankle Alphabet  - 1 x daily - 7 x weekly - 3 sets - 10 reps - Standing Gastroc Stretch  - 1 x daily - 7 x weekly - 3 sets - 10 reps - Standing Ankle Dorsiflexion Stretch  - 1 x daily - 7 x weekly - 3 sets - 10 reps  ASSESSMENT:  CLINICAL IMPRESSION: *** She will benefit from continued skilled OPPT services to address her limited right ankle, pain and difficulty with mobility to improve her overall quality of life, and to improve safety/Ind with functional mobility.   OBJECTIVE IMPAIRMENTS Abnormal gait, decreased activity tolerance, decreased balance, decreased mobility, difficulty walking, decreased ROM, decreased strength, increased edema, increased fascial restrictions, impaired flexibility, impaired sensation, and pain.   ACTIVITY LIMITATIONS sitting, standing, squatting, sleeping, stairs, transfers, bed mobility, dressing, and locomotion level  PARTICIPATION LIMITATIONS: meal prep, cleaning, laundry, driving, shopping, community activity, occupation, and yard work  PERSONAL FACTORS Education, Past/current experiences, Time since onset of injury/illness/exacerbation, and 3+ comorbidities: HTN, obesity, general sensory neuropathy  are also affecting patient's functional outcome.   REHAB POTENTIAL: Good  CLINICAL DECISION MAKING: Stable/uncomplicated  EVALUATION COMPLEXITY: Moderate   GOALS: Goals reviewed with patient? Yes  SHORT TERM GOALS: Target date: 08/29/2022  Pt will improve FOTO score  to 52% indicating improved disability score per this outcome measure. Baseline: 26% Goal status: INITIAL    LONG TERM GOALS: Target date: 09/19/2022   Pt will report 1-2/10 pain at it's worst, to improve overall tolerance with ADL's, IADL's, and functional mobility. Baseline: can go up to 7/10 Goal status: INITIAL  2.  Pt will improve gross R ankle strength to 4+/5, indicating improved strength for safe balance and gait without AD. Baseline: 3-/5 to 4/5 Goal status: INITIAL  3.  Pt will improve gross R ankle AROM by at least 15deg, indicating improved ankle mobility required for safe and Ind gait. Baseline: 15deg from neutral (DF), 10deg from neutral (PF), 20deg (IV), 10deg (EV) Goal status: INITIAL  4.  Pt will be able to tolerate >5 min of static standing indicating improved safety and tolerance with upright functional mobility/IADL's. Baseline: <58mn before needing to move/sit Goal status: INITIAL    PLAN: PT FREQUENCY: 2x/week  PT DURATION: 6 weeks  PLANNED INTERVENTIONS: Therapeutic exercises, Therapeutic activity, Neuromuscular re-education, Balance training, Gait training, Patient/Family education, Self Care, Joint mobilization, Joint manipulation, Stair training, DME instructions, Dry Needling, Electrical stimulation, Cryotherapy, Moist heat, Splintting, Taping, Traction, Ultrasound, Ionotophoresis 469mml Dexamethasone, Manual therapy, and Re-evaluation  PLAN FOR NEXT SESSION: review original HEP and assure appropriate performance pacing, attempt strength interventions without pain provocation.  Reassessment and decide whether to refer back to podiatry or continue with PT. Pt has a 30VL annually.     Patient Details  Name: Olivia Bass MRN: [id number] Date of Birth: 1961-04-29 Referring Provider:  Samara Deist, DPM  Encounter Date: 08/09/2022   Janna Arch, PT 08/08/2022, 4:32 PM  [place] [address] [place], [place], [address] Phone: [phone]   Fax:  [phone]

## 2022-08-09 ENCOUNTER — Ambulatory Visit: Payer: BC Managed Care – PPO

## 2022-08-09 DIAGNOSIS — M6281 Muscle weakness (generalized): Secondary | ICD-10-CM | POA: Diagnosis not present

## 2022-08-09 DIAGNOSIS — M25571 Pain in right ankle and joints of right foot: Secondary | ICD-10-CM | POA: Diagnosis not present

## 2022-08-09 DIAGNOSIS — M25671 Stiffness of right ankle, not elsewhere classified: Secondary | ICD-10-CM | POA: Diagnosis not present

## 2022-08-09 DIAGNOSIS — R262 Difficulty in walking, not elsewhere classified: Secondary | ICD-10-CM | POA: Diagnosis not present

## 2022-08-11 DIAGNOSIS — H401121 Primary open-angle glaucoma, left eye, mild stage: Secondary | ICD-10-CM | POA: Diagnosis not present

## 2022-08-11 DIAGNOSIS — H40001 Preglaucoma, unspecified, right eye: Secondary | ICD-10-CM | POA: Diagnosis not present

## 2022-08-11 DIAGNOSIS — H2513 Age-related nuclear cataract, bilateral: Secondary | ICD-10-CM | POA: Diagnosis not present

## 2022-08-11 DIAGNOSIS — Z83511 Family history of glaucoma: Secondary | ICD-10-CM | POA: Diagnosis not present

## 2022-08-12 ENCOUNTER — Encounter: Payer: BC Managed Care – PPO | Admitting: Physical Therapy

## 2022-08-16 ENCOUNTER — Ambulatory Visit: Payer: BC Managed Care – PPO

## 2022-08-16 DIAGNOSIS — M25571 Pain in right ankle and joints of right foot: Secondary | ICD-10-CM | POA: Diagnosis not present

## 2022-08-16 DIAGNOSIS — M6281 Muscle weakness (generalized): Secondary | ICD-10-CM

## 2022-08-16 DIAGNOSIS — R262 Difficulty in walking, not elsewhere classified: Secondary | ICD-10-CM | POA: Diagnosis not present

## 2022-08-16 DIAGNOSIS — M25671 Stiffness of right ankle, not elsewhere classified: Secondary | ICD-10-CM | POA: Diagnosis not present

## 2022-08-16 NOTE — Therapy (Signed)
OUTPATIENT PHYSICAL THERAPY LOWER EXTREMITY TREATMENT NOTE/RECERT   Patient Name: Olivia Bass MRN: [id number] DOB:May 24, 1961, 61 y.o., female Today's Date: 08/16/2022   PT End of Session - 08/16/22 1606     Visit Number 9    Number of Visits 20    Date for PT Re-Evaluation 09/20/22    Authorization Type [employer] COMM Pro    Authorization Time Period 06/27/22-08/08/22    Authorization - Visit Number 8    Authorization - Number of Visits 30    Progress Note Due on Visit 10    PT Start Time 1602    PT Stop Time 1634    PT Time Calculation (min) 32 min    Activity Tolerance Patient tolerated treatment well    Behavior During Therapy WFL for tasks assessed/performed                 Past Medical History:  Diagnosis Date   Kidney stones    Metal plates in right arm    montagia   UTI (urinary tract infection)    Past Surgical History:  Procedure Laterality Date   DILATION AND CURETTAGE OF UTERUS     age 13 - miscarriage   metal plate in right arm     TONSILLECTOMY     TONSILLECTOMY     WISDOM TOOTH EXTRACTION     age 64   There are no problems to display for this patient.   PCP: Tracie Harrier, MD  REFERRING PROVIDER: Samara Deist, DPM  REFERRING DIAG: R ankle pain  THERAPY DIAG:  Pain in right ankle and joints of right foot  Stiffness of right ankle, not elsewhere classified  Muscle weakness (generalized)  Difficulty in walking, not elsewhere classified  Rationale for Evaluation and Treatment Rehabilitation  ONSET DATE: 2020  SUBJECTIVE:   SUBJECTIVE STATEMENT: Patient reports she is significantly improving. Has been compliant with HEP.  PAIN:  Are you having pain? 2-3/10 R toes  PERTINENT HISTORY: Olivia Bass is a 51yoF who is referred to [facility] PT from podiatry for chronic dispersed Rt foot pain. Per podiatry notes, pain is quite dispersed, uncharacteristic of a single orthopedic problem, imaging has been reassuring. Podiatry wanted to see if  PT could help with pain issues, but feel  that further workup with Rheumatology may be warranted. Pt followed by neurology since 2021 for paresthesias, NCS revealing of primary sensory neuropathy, pt not interested in trial of Cymbalta at that time due to concerns of side effects impacting her caregiver duties.   Per Dr. Vickki Muff: "Her pain is very diffuse in nature. Not consistent with any particular or specific concern. She does have calf pain and Achilles tendinitis with sinus tarsi type pain. Some mild tarsal tunnel syndrome as well as forefoot metatarsalgia. I did discuss the possibility of a neurological component. This could also have a rheumatologic component. Chart review reveals an ESR of 11. No other labs have been performed from a rheumatology standpoint. I am going to refer her to physical therapy. If she is not improved we will defer back to primary care for possible rheumatoid work-up versus referral to neurology for neurological work-up."  Per pt report: hx of multiple ankle injuries over the years including her aunt stepping on her feet, twisting ankle on the stairs, and falling. Imaging studies unremarkable. Good short term response to oral steroid courses, but increased pain with remote Morton's neuroma injection. CC include pain/numbness in Left toes (2nd-4th), uses a SPC intermittently for antalgia; pain impacts pt's  ability to stand, sit, fall asleep, stay asleep, drive, and maintain balance, as well as many other IADL.  PAIN:  Are you having pain? 3/10 pain in R dorsum of foot, constant ache and tingling into toes  PRECAUTIONS: None  WEIGHT BEARING RESTRICTIONS No  FALLS:  Has patient fallen in last 6 months? No  OCCUPATION: [employer]; work from home at [employer]; full-time  PLOF: Independent  PATIENT GOALS to be able to walk again without pain; wants to be able to use treadmill     TODAY'S TREATMENT:  Manual Therapy:   PROM R foot - ankle DF/PF- near  full ROM- No pain provoked with ankle EV/IV today.     -Dorsal/ventral mobs to metatarsals- 1-4- x 15-20 each   -R TC joint mobilization gradie I-II 2x30 sec   -Digits 4-5 PA grade I-II mobilization 30 sec each    Therex: Right LE -Seated: Toe scrunch x 15 reps x 2 sets.  -R Toe splays (No assist) x multiple reps. Pt reports improved control of movement today. Good ability to separate all toes today- No report of increased pain.   -Isometrics: DF/PF/EV/IV 10 reps of each ; 5 second holds -Active sidelye  EV x 12 reps -Toe yoga- Lift great toe into ext while flexing other 4 toes x 12 reps then switch (extending 4 toes and flexing great toe) x 12 reps  Arch lifts- x 15 reps with 3 sec hold  Ice cup massage to 2nd/3rd dorsal metatarsals and peroneal region x 3 min      PATIENT EDUCATION:  Education details: pain management techniques, using ice at home, exercise technique, body mechanics Person educated: Patient Education method: Explanation, Demonstration, Tactile cues, and Verbal cues Education comprehension: verbalized understanding, returned demonstration, verbal cues required, and needs further education   HOME EXERCISE PROGRAM: No updates today, pt to continue HEP as previously given Access Code: [email or web address] URL: [email or web address] Date: 06/30/2022 Prepared by: Sande Brothers  Exercises - Toe Yoga - Alternating Great Toe and Lesser Toe Extension  - 1 x daily - 7 x weekly - 3 sets - 10 reps - Arch Lifting  - 1 x daily - 7 x weekly - 3 sets - 10 reps - Toe Spreading  - 1 x daily - 7 x weekly - 3 sets - 10 reps   Access Code: [email or web address] URL: [email or web address] Date: 06/27/2022 Prepared by: Amalia Hailey  Exercises - Seated Ankle Alphabet  - 1 x daily - 7 x weekly - 3 sets - 10 reps - Standing Gastroc Stretch  - 1 x daily - 7 x weekly - 3 sets - 10 reps - Standing Ankle Dorsiflexion Stretch  - 1 x daily - 7 x weekly - 3 sets - 10  reps  ASSESSMENT:  CLINICAL IMPRESSION: Patient continue to progress well- consistently reporting pain at 3/10 or less this week. She presents with good recall and technique with all foot exercises and less tender with manual techniques overall today. Continued to report decrease pain after session and responded well to brief ice cup massage to point tender areas. She will benefit from continued skilled OPPT services to address her limited right ankle, pain and difficulty with mobility to improve her overall quality of life, and to improve safety/Ind with functional mobility.   OBJECTIVE IMPAIRMENTS Abnormal gait, decreased activity tolerance, decreased balance, decreased mobility, difficulty walking, decreased ROM, decreased strength, increased edema, increased fascial restrictions, impaired flexibility, impaired sensation, and pain.   ACTIVITY LIMITATIONS sitting, standing,  squatting, sleeping, stairs, transfers, bed mobility, dressing, and locomotion level  PARTICIPATION LIMITATIONS: meal prep, cleaning, laundry, driving, shopping, community activity, occupation, and yard work  PERSONAL FACTORS Education, Past/current experiences, Time since onset of injury/illness/exacerbation, and 3+ comorbidities: HTN, obesity, general sensory neuropathy  are also affecting patient's functional outcome.   REHAB POTENTIAL: Good  CLINICAL DECISION MAKING: Stable/uncomplicated  EVALUATION COMPLEXITY: Moderate   GOALS: Goals reviewed with patient? Yes  SHORT TERM GOALS: Target date: 09/06/2022  Pt will improve FOTO score to 52% indicating improved disability score per this outcome measure. Baseline: 26% 10/10: 40%  Goal status: partially met     LONG TERM GOALS: Target date: 09/20/2022     Pt will report 1-2/10 pain at it's worst, to improve overall tolerance with ADL's, IADL's, and functional mobility. Baseline: can go up to 7/10 10/10: 4-5/10 Goal status: INITIAL  2.  Pt will improve gross  R ankle strength to 4+/5, indicating improved strength for safe balance and gait without AD. Baseline: 3-/5 to 4/5 10/10: df 3+ eversion, inversion df 4+/5 Goal status: Partially Met   3.  Pt will improve gross R ankle AROM by at least 15deg, indicating improved ankle mobility required for safe and Ind gait. Baseline: 15deg from neutral (DF), 10deg from neutral (PF), 20deg (IV), 10deg (EV); 10/10: DF 4 degrees PF WFL 55 Inversion: 40, eversion 15 degrees  Goal status: Partially MET  4.  Pt will be able to tolerate >5 min of static standing indicating improved safety and tolerance with upright functional mobility/IADL's. Baseline: <50mn before needing to move/sit 10/10: able to tolerate Goal status: MET  5.  Patient will be modified independent in walking on even/uneven surface with least restrictive assistive device, for 20+ minutes without rest break, reporting some difficulty or less to improve walking tolerance with community ambulation including grocery shopping, going to church,etc.  Baseline: 10/10; unable Goal status: Initial   PLAN: PT FREQUENCY: 2x/week  PT DURATION: 6 weeks  PLANNED INTERVENTIONS: Therapeutic exercises, Therapeutic activity, Neuromuscular re-education, Balance training, Gait training, Patient/Family education, Self Care, Joint mobilization, Joint manipulation, Stair training, DME instructions, Dry Needling, Electrical stimulation, Cryotherapy, Moist heat, Splintting, Taping, Traction, Ultrasound, Ionotophoresis 4103mml Dexamethasone, Manual therapy, and Re-evaluation  PLAN FOR NEXT SESSION: review original HEP and assure appropriate performance pacing, attempt strength interventions without pain provocation.    Patient Details  Name: Olivia GREENSTEINRN: [id number] of Birth: [birth year]eferring Provider:  FoSamara DeistDPM  Encounter Date: 08/16/2022   JeLewis MoccasinPT 08/16/2022, 4:56 PM  [place]  RELibert[facility] [place] BayERVICES [id number] LanedSleepy [id number]: [phone] Fax:  [phone]

## 2022-08-24 ENCOUNTER — Ambulatory Visit: Payer: BC Managed Care – PPO

## 2022-08-24 DIAGNOSIS — M25671 Stiffness of right ankle, not elsewhere classified: Secondary | ICD-10-CM

## 2022-08-24 DIAGNOSIS — R262 Difficulty in walking, not elsewhere classified: Secondary | ICD-10-CM | POA: Diagnosis not present

## 2022-08-24 DIAGNOSIS — M25571 Pain in right ankle and joints of right foot: Secondary | ICD-10-CM | POA: Diagnosis not present

## 2022-08-24 DIAGNOSIS — M6281 Muscle weakness (generalized): Secondary | ICD-10-CM | POA: Diagnosis not present

## 2022-08-24 NOTE — Therapy (Signed)
OUTPATIENT PHYSICAL THERAPY LOWER EXTREMITY TREATMENT NOTE Physical Therapy Progress Note   Dates of reporting period  06/27/22   to   08/24/22    Patient Name: Olivia Bass MRN: [id number] DOB:1961/08/06, 61 y.o., female Today's Date: 08/24/2022   PT End of Session - 08/24/22 1547     Visit Number 10    Number of Visits 20    Date for PT Re-Evaluation 09/20/22    Authorization Type [employer] COMM Pro    Authorization Time Period 08/09/22-09/20/22    Authorization - Visit Number 9    Authorization - Number of Visits 30    Progress Note Due on Visit 20    PT Start Time 2202    PT Stop Time 1555    PT Time Calculation (min) 40 min    Equipment Utilized During Treatment Gait belt    Activity Tolerance Patient tolerated treatment well;Patient limited by pain;Treatment limited secondary to agitation   Pt having cardiac CP per her report, VSS, no SOB,   Behavior During Therapy Impulsive;Restless                 Past Medical History:  Diagnosis Date   Kidney stones    Metal plates in right arm    montagia   UTI (urinary tract infection)    Past Surgical History:  Procedure Laterality Date   DILATION AND CURETTAGE OF UTERUS     age 61 - miscarriage   metal plate in right arm     TONSILLECTOMY     TONSILLECTOMY     WISDOM TOOTH EXTRACTION     age 61   There are no problems to display for this patient.   PCP: Tracie Harrier, MD  REFERRING PROVIDER: Samara Deist, DPM  REFERRING DIAG: R ankle pain  THERAPY DIAG:  Pain in right ankle and joints of right foot  Stiffness of right ankle, not elsewhere classified  Muscle weakness (generalized)  Difficulty in walking, not elsewhere classified  Rationale for Evaluation and Treatment Rehabilitation  ONSET DATE: 2020  SUBJECTIVE:   SUBJECTIVE STATEMENT: Patient reports she is significantly improving. Has been compliant with HEP.  PAIN:  Are you having pain? 2-3/10 R toes  PERTINENT HISTORY: Olivia Bass is a 61yoF who is referred to [facility] from podiatry for chronic dispersed Rt foot pain. Per podiatry notes, pain is quite dispersed, uncharacteristic of a single orthopedic problem, imaging has been reassuring. Podiatry wanted to see if PT could help with pain issues, but feel  that further workup with Rheumatology may be warranted. Pt followed by neurology since 2021 for paresthesias, NCS revealing of primary sensory neuropathy, pt not interested in trial of Cymbalta at that time due to concerns of side effects impacting her caregiver duties.   Per Dr. Vickki Muff: "Her pain is very diffuse in nature. Not consistent with any particular or specific concern. She does have calf pain and Achilles tendinitis with sinus tarsi type pain. Some mild tarsal tunnel syndrome as well as forefoot metatarsalgia. I did discuss the possibility of a neurological component. This could also have a rheumatologic component. Chart review reveals an ESR of 11. No other labs have been performed from a rheumatology standpoint. I am going to refer her to physical therapy. If she is not improved we will defer back to primary care for possible rheumatoid work-up versus referral to neurology for neurological work-up."  Per pt report: hx of multiple ankle injuries over the years including her aunt stepping on her  feet, twisting ankle on the stairs, and falling. Imaging studies unremarkable. Good short term response to oral steroid courses, but increased pain with remote Morton's neuroma injection. CC include pain/numbness in Left toes (2nd-4th), uses a SPC intermittently for antalgia; pain impacts pt's ability to stand, sit, fall asleep, stay asleep, drive, and maintain balance, as well as many other IADL.  PAIN:  Are you having pain? 3/10 pain in R dorsum of foot, constant ache and tingling into toes PRECAUTIONS: None WEIGHT BEARING RESTRICTIONS No FALLS:  Has patient fallen in last 6 months? No OCCUPATION: [employer];  work from home at [employer]; full-time PLOF: Independent PATIENT GOALS to be able to walk again without pain; wants to be able to use treadmill     TODAY'S TREATMENT:  Functional outcome measures:  6MWT: 649f SPC LUE, 2-poin tgait, RLE abducted limb, Left lateral lean; CP at 6625fwith 30se to go, reports she has PVC daignosis  pain increase from 3/10 to 4/10  1059fetro AMB 31.54sec  Post test: 151/3m56m 66pm 99% SPO2   RetroAMB gait speed:  0.096m/61mSPC LUE   towel scruntches x10 toe splays x10 towel scruntches x10 toe splays x10   -isometric flat footed bilat inversion clown ball squeeze 15x5secH  -isometric flat footed bilat eversion against gait belt in neutral 15x5secH  -seated RLE heel raises on yoga brick x10, ankle DF A/ROM x10       PATIENT EDUCATION:  Education details: Please followup with Dr fowleVickki Muffnot rely on youtube for self diagnosis of foot pain Person educated: Patient Education method: Explanation, Demonstration, Tactile cues, and Verbal cues Education comprehension: verbalized understanding, returned demonstration, verbal cues required, and needs further education   HOME EXERCISE PROGRAM: No updates today, pt to continue HEP as previously given Access Code: [email or web address] URL: [email or web address] Date: 06/30/2022 Prepared by: JeffrSande Brotherscises - Toe Yoga - Alternating Great Toe and Lesser Toe Extension  - 1 x daily - 7 x weekly - 3 sets - 10 reps - Arch Lifting  - 1 x daily - 7 x weekly - 3 sets - 10 reps - Toe Spreading  - 1 x daily - 7 x weekly - 3 sets - 10 reps  ASSESSMENT:  CLINICAL IMPRESSION: Patient continue to progress well- now able to partake in some functional assessment. Pt perseverates on various recent epiphanies she has had regarding reading on why she continues to have pain- author encouraged pt to call Dr. FowleKarna Christmasce to schedule FU, reminded her that he wanted to look into potential tests and/or  referrals. Pt partakes in 6MWT and retroAMB gait speed test. Continued with other interventions as POC has indicated. Pt is somewhat impulsive at times, author cues her to keep on track with today's interventions and not perform ad lib exercises during recovery intervals, particularly when they are causing increased pain. Pt continues to show gradual progressive improvements in pain control, likely would do better for prolonged AMB with high level of assistive device. She will benefit from continued skilled OPPT services to address her limited right ankle, pain and difficulty with mobility to improve her overall quality of life, and to improve safety/Ind with functional mobility.   OBJECTIVE IMPAIRMENTS Abnormal gait, decreased activity tolerance, decreased balance, decreased mobility, difficulty walking, decreased ROM, decreased strength, increased edema, increased fascial restrictions, impaired flexibility, impaired sensation, and pain.   ACTIVITY LIMITATIONS sitting, standing, squatting, sleeping, stairs, transfers, bed mobility, dressing, and locomotion level  PARTICIPATION LIMITATIONS: meal prep,  cleaning, laundry, driving, shopping, community activity, occupation, and yard work  PERSONAL FACTORS Education, Past/current experiences, Time since onset of injury/illness/exacerbation, and 3+ comorbidities: HTN, obesity, general sensory neuropathy  are also affecting patient's functional outcome.   REHAB POTENTIAL: Good  CLINICAL DECISION MAKING: Stable/uncomplicated  EVALUATION COMPLEXITY: Moderate   GOALS: Goals reviewed with patient? Yes  SHORT TERM GOALS: Target date: 09/14/2022  Pt will improve FOTO score to 52% indicating improved disability score per this outcome measure. Baseline: 26% 10/10: 40%  Goal status: partially met     LONG TERM GOALS: Target date: 09/20/2022     Pt will report 1-2/10 pain at it's worst, to improve overall tolerance with ADL's, IADL's, and functional  mobility. Baseline: can go up to 7/10 10/10: 4-5/10 Goal status: INITIAL  2.  Pt will improve gross R ankle strength to 4+/5, indicating improved strength for safe balance and gait without AD. Baseline: 3-/5 to 4/5 10/10: df 3+ eversion, inversion df 4+/5 Goal status: Partially Met   3.  Pt will improve gross R ankle AROM by at least 15deg, indicating improved ankle mobility required for safe and Ind gait. Baseline: 15deg from neutral (DF), 10deg from neutral (PF), 20deg (IV), 10deg (EV); 10/10: DF 4 degrees PF WFL 55 Inversion: 40, eversion 15 degrees  Goal status: Partially MET  4.  Pt will be able to tolerate >5 min of static standing indicating improved safety and tolerance with upright functional mobility/IADL's. Baseline: <32mn before needing to move/sit 10/10: able to tolerate Goal status: MET  5.  Patient will be modified independent in walking on even/uneven surface with least restrictive assistive device, for 20+ minutes without rest break, reporting some difficulty or less to improve walking tolerance with community ambulation including grocery shopping, going to church,etc.  Baseline: 10/10; unable Goal status: Initial   PLAN: PT FREQUENCY: 2x/week  PT DURATION: 6 weeks  PLANNED INTERVENTIONS: Therapeutic exercises, Therapeutic activity, Neuromuscular re-education, Balance training, Gait training, Patient/Family education, Self Care, Joint mobilization, Joint manipulation, Stair training, DME instructions, Dry Needling, Electrical stimulation, Cryotherapy, Moist heat, Splintting, Taping, Traction, Ultrasound, Ionotophoresis 436mml Dexamethasone, Manual therapy, and Re-evaluation  PLAN FOR NEXT SESSION: Continue to transition from isometric activation to gentle resisted   Patient Details  Name: Olivia YAKLINRN: [id number] of Birth: [birth year]eferring Provider:  FoSamara DeistDPM  Encounter Date: 08/24/2022   BuEtta GrandchildPT 08/24/2022, 3:50 PM  [address] [address]hone: [phone] Fax:  [phone]

## 2022-08-26 ENCOUNTER — Encounter: Payer: BC Managed Care – PPO | Admitting: Physical Therapy

## 2022-08-30 ENCOUNTER — Ambulatory Visit: Payer: BC Managed Care – PPO

## 2022-08-30 NOTE — Therapy (Incomplete)
OUTPATIENT PHYSICAL THERAPY LOWER EXTREMITY TREATMENT NOTE/RECERT   Patient Name: Olivia Bass MRN: [id number] DOB:Mar 19, 1961, 61 y.o., female Today's Date: 08/30/2022         Past Medical History:  Diagnosis Date   Kidney stones    Metal plates in right arm    montagia   UTI (urinary tract infection)    Past Surgical History:  Procedure Laterality Date   DILATION AND CURETTAGE OF UTERUS     age 45 - miscarriage   metal plate in right arm     TONSILLECTOMY     TONSILLECTOMY     WISDOM TOOTH EXTRACTION     age 79   There are no problems to display for this patient.   PCP: Tracie Harrier, MD  REFERRING PROVIDER: Samara Deist, DPM  REFERRING DIAG: R ankle pain  THERAPY DIAG:  No diagnosis found.  Rationale for Evaluation and Treatment Rehabilitation  ONSET DATE: 2020  SUBJECTIVE:   SUBJECTIVE STATEMENT: Patient reports she is significantly improving. Has been compliant with HEP.  PAIN:  Are you having pain? 2-3/10 R toes  PERTINENT HISTORY: Olivia Bass is a 58yoF who is referred to [facility] PT from podiatry for chronic dispersed Rt foot pain. Per podiatry notes, pain is quite dispersed, uncharacteristic of a single orthopedic problem, imaging has been reassuring. Podiatry wanted to see if PT could help with pain issues, but feel  that further workup with Rheumatology may be warranted. Pt followed by neurology since 2021 for paresthesias, NCS revealing of primary sensory neuropathy, pt not interested in trial of Cymbalta at that time due to concerns of side effects impacting her caregiver duties.   Per Dr. Vickki Muff: "Her pain is very diffuse in nature. Not consistent with any particular or specific concern. She does have calf pain and Achilles tendinitis with sinus tarsi type pain. Some mild tarsal tunnel syndrome as well as forefoot metatarsalgia. I did discuss the possibility of a neurological component. This could also have a rheumatologic component. Chart  review reveals an ESR of 11. No other labs have been performed from a rheumatology standpoint. I am going to refer her to physical therapy. If she is not improved we will defer back to primary care for possible rheumatoid work-up versus referral to neurology for neurological work-up."  Per pt report: hx of multiple ankle injuries over the years including her aunt stepping on her feet, twisting ankle on the stairs, and falling. Imaging studies unremarkable. Good short term response to oral steroid courses, but increased pain with remote Morton's neuroma injection. CC include pain/numbness in Left toes (2nd-4th), uses a SPC intermittently for antalgia; pain impacts pt's ability to stand, sit, fall asleep, stay asleep, drive, and maintain balance, as well as many other IADL.  PAIN:  Are you having pain? 3/10 pain in R dorsum of foot, constant ache and tingling into toes  PRECAUTIONS: None  WEIGHT BEARING RESTRICTIONS No  FALLS:  Has patient fallen in last 6 months? No  OCCUPATION: [employer]; work from home at [employer]; full-time  PLOF: Independent  PATIENT GOALS to be able to walk again without pain; wants to be able to use treadmill     TODAY'S TREATMENT: Goals: See below for details   PROM  R LE digits ext/flex modified to pain-limited range x multiple reps. PT did have to adjust range throughout due to pain.   - Dorsal/ventral mobs to metatarsals- 1-4- x 15-20 each   -R TC joint mobilization gradie I-II 2x30 sec   -  Digits 4-5 PA grade I-II mobilization 30 sec each   Therex: Right LE Seated:  R Toe splays (PT providing intermittent assist) x multiple reps. Pt reports improved control of movement today.   Isometrics: DF/PF/EV/IV 10 reps of each ; 5 second holds cues for 40-50% output      PATIENT EDUCATION:  Education details: pain management techniques, using ice at home, exercise technique, body mechanics Person educated: Patient Education method:  Explanation, Demonstration, Tactile cues, and Verbal cues Education comprehension: verbalized understanding, returned demonstration, verbal cues required, and needs further education   HOME EXERCISE PROGRAM: No updates today, pt to continue HEP as previously given Access Code: [email or web address] URL: [email or web address] Date: 06/30/2022 Prepared by: Sande Brothers  Exercises - Toe Yoga - Alternating Great Toe and Lesser Toe Extension  - 1 x daily - 7 x weekly - 3 sets - 10 reps - Arch Lifting  - 1 x daily - 7 x weekly - 3 sets - 10 reps - Toe Spreading  - 1 x daily - 7 x weekly - 3 sets - 10 reps   Access Code: [email or web address] URL: [email or web address] Date: 06/27/2022 Prepared by: Amalia Hailey  Exercises - Seated Ankle Alphabet  - 1 x daily - 7 x weekly - 3 sets - 10 reps - Standing Gastroc Stretch  - 1 x daily - 7 x weekly - 3 sets - 10 reps - Standing Ankle Dorsiflexion Stretch  - 1 x daily - 7 x weekly - 3 sets - 10 reps  ASSESSMENT:  CLINICAL IMPRESSION: Patient's goals assessed with patient demonstrating excellent progression towards functional measures. Her ROM and strength are significantly improving as well as a significant decrease in high pain levels. New goal addressing mobility added to POC. Patient agreeable to POC. Patient highly motivated for continuation of care and decreased pain. She will benefit from continued skilled OPPT services to address her limited right ankle, pain and difficulty with mobility to improve her overall quality of life, and to improve safety/Ind with functional mobility.   OBJECTIVE IMPAIRMENTS Abnormal gait, decreased activity tolerance, decreased balance, decreased mobility, difficulty walking, decreased ROM, decreased strength, increased edema, increased fascial restrictions, impaired flexibility, impaired sensation, and pain.   ACTIVITY LIMITATIONS sitting, standing, squatting, sleeping, stairs, transfers, bed mobility,  dressing, and locomotion level  PARTICIPATION LIMITATIONS: meal prep, cleaning, laundry, driving, shopping, community activity, occupation, and yard work  PERSONAL FACTORS Education, Past/current experiences, Time since onset of injury/illness/exacerbation, and 3+ comorbidities: HTN, obesity, general sensory neuropathy  are also affecting patient's functional outcome.   REHAB POTENTIAL: Good  CLINICAL DECISION MAKING: Stable/uncomplicated  EVALUATION COMPLEXITY: Moderate   GOALS: Goals reviewed with patient? Yes  SHORT TERM GOALS: Target date: 09/20/2022  Pt will improve FOTO score to 52% indicating improved disability score per this outcome measure. Baseline: 26% 10/10: 40%  Goal status: partially met     LONG TERM GOALS: Target date: 09/20/2022     Pt will report 1-2/10 pain at it's worst, to improve overall tolerance with ADL's, IADL's, and functional mobility. Baseline: can go up to 7/10 10/10: 4-5/10 Goal status: INITIAL  2.  Pt will improve gross R ankle strength to 4+/5, indicating improved strength for safe balance and gait without AD. Baseline: 3-/5 to 4/5 10/10: df 3+ eversion, inversion df 4+/5 Goal status: Partially Met   3.  Pt will improve gross R ankle AROM by at least 15deg, indicating improved ankle mobility required for safe and Ind gait. Baseline: 15deg from neutral (DF), 10deg from  neutral (PF), 20deg (IV), 10deg (EV); 10/10: DF 4 degrees PF WFL 55 Inversion: 40, eversion 15 degrees  Goal status: Partially MET  4.  Pt will be able to tolerate >5 min of static standing indicating improved safety and tolerance with upright functional mobility/IADL's. Baseline: <68mn before needing to move/sit 10/10: able to tolerate Goal status: MET  5.  Patient will be modified independent in walking on even/uneven surface with least restrictive assistive device, for 20+ minutes without rest break, reporting some difficulty or less to improve walking tolerance with  community ambulation including grocery shopping, going to church,etc.  Baseline: 10/10; unable Goal status: Initial   PLAN: PT FREQUENCY: 2x/week  PT DURATION: 6 weeks  PLANNED INTERVENTIONS: Therapeutic exercises, Therapeutic activity, Neuromuscular re-education, Balance training, Gait training, Patient/Family education, Self Care, Joint mobilization, Joint manipulation, Stair training, DME instructions, Dry Needling, Electrical stimulation, Cryotherapy, Moist heat, Splintting, Taping, Traction, Ultrasound, Ionotophoresis 463mml Dexamethasone, Manual therapy, and Re-evaluation  PLAN FOR NEXT SESSION: review original HEP and assure appropriate performance pacing, attempt strength interventions without pain provocation. Reassessment and decide whether to refer back to podiatry or continue with PT. Pt has a 30VL annually.     Patient Details  Name: Olivia VOSLERRN: [id number] of Birth: 1/May 31, 1962eferring Provider:  FoSamara DeistDPM  Encounter Date: 08/30/2022   JoGwenlyn SaranPT, DPT Physical Therapist- [facility]Red[facility]10/31/23, 1:52 PM   CoEqualityAIN REHeart Of America Surgery Center LLCERVICES [address]d[place]hone: [phone] Fax:  [phone]

## 2022-09-01 ENCOUNTER — Ambulatory Visit: Payer: BC Managed Care – PPO | Attending: Podiatry

## 2022-09-01 NOTE — Therapy (Incomplete)
OUTPATIENT PHYSICAL THERAPY LOWER EXTREMITY TREATMENT NOTE/RECERT   Patient Name: Olivia Bass MRN: [id number] DOB:[birth year], 61 y.o., female Today's Date: 09/01/2022         Past Medical History:  Diagnosis Date   Kidney stones    Metal plates in right arm    montagia   UTI (urinary tract infection)    Past Surgical History:  Procedure Laterality Date   DILATION AND CURETTAGE OF UTERUS     age 84 - miscarriage   metal plate in right arm     TONSILLECTOMY     TONSILLECTOMY     WISDOM TOOTH EXTRACTION     age 51   There are no problems to display for this patient.   PCP: Tracie Harrier, MD  REFERRING PROVIDER: Samara Deist, DPM  REFERRING DIAG: R ankle pain  THERAPY DIAG:  No diagnosis found.  Rationale for Evaluation and Treatment Rehabilitation  ONSET DATE: 2020  SUBJECTIVE:   SUBJECTIVE STATEMENT: Patient reports she is significantly improving. Has been compliant with HEP.  PAIN:  Are you having pain? 2-3/10 R toes  PERTINENT HISTORY: Olivia Bass is a 59yoF who is referred to [facility] PT from podiatry for chronic dispersed Rt foot pain. Per podiatry notes, pain is quite dispersed, uncharacteristic of a single orthopedic problem, imaging has been reassuring. Podiatry wanted to see if PT could help with pain issues, but feel  that further workup with Rheumatology may be warranted. Pt followed by neurology since 2021 for paresthesias, NCS revealing of primary sensory neuropathy, pt not interested in trial of Cymbalta at that time due to concerns of side effects impacting her caregiver duties.   Per Dr. Vickki Muff: "Her pain is very diffuse in nature. Not consistent with any particular or specific concern. She does have calf pain and Achilles tendinitis with sinus tarsi type pain. Some mild tarsal tunnel syndrome as well as forefoot metatarsalgia. I did discuss the possibility of a neurological component. This could also have a rheumatologic component. Chart  review reveals an ESR of 11. No other labs have been performed from a rheumatology standpoint. I am going to refer her to physical therapy. If she is not improved we will defer back to primary care for possible rheumatoid work-up versus referral to neurology for neurological work-up."  Per pt report: hx of multiple ankle injuries over the years including her aunt stepping on her feet, twisting ankle on the stairs, and falling. Imaging studies unremarkable. Good short term response to oral steroid courses, but increased pain with remote Morton's neuroma injection. CC include pain/numbness in Left toes (2nd-4th), uses a SPC intermittently for antalgia; pain impacts pt's ability to stand, sit, fall asleep, stay asleep, drive, and maintain balance, as well as many other IADL.   PAIN:  Are you having pain? 3/10 pain in R dorsum of foot, constant ache and tingling into toes   PRECAUTIONS: None  WEIGHT BEARING RESTRICTIONS No  FALLS:  Has patient fallen in last 6 months? No  OCCUPATION: [employer]; work from home at [employer]; full-time  PLOF: Independent  PATIENT GOALS to be able to walk again without pain; wants to be able to use treadmill     TODAY'S TREATMENT: Goals: See below for details   PROM  R LE digits ext/flex modified to pain-limited range x multiple reps. PT did have to adjust range throughout due to pain.   - Dorsal/ventral mobs to metatarsals- 1-4- x 15-20 each   -R TC joint mobilization gradie I-II  2x30 sec   -Digits 4-5 PA grade I-II mobilization 30 sec each   Therex: Right LE Seated:  R Toe splays (PT providing intermittent assist) x multiple reps. Pt reports improved control of movement today.   Isometrics: DF/PF/EV/IV 10 reps of each ; 5 second holds cues for 40-50% output      PATIENT EDUCATION:  Education details: pain management techniques, using ice at home, exercise technique, body mechanics Person educated: Patient Education method:  Explanation, Demonstration, Tactile cues, and Verbal cues Education comprehension: verbalized understanding, returned demonstration, verbal cues required, and needs further education   HOME EXERCISE PROGRAM: No updates today, pt to continue HEP as previously given Access Code: [email or web address] URL: [email or web address] Date: 06/30/2022 Prepared by: Sande Brothers  Exercises - Toe Yoga - Alternating Great Toe and Lesser Toe Extension  - 1 x daily - 7 x weekly - 3 sets - 10 reps - Arch Lifting  - 1 x daily - 7 x weekly - 3 sets - 10 reps - Toe Spreading  - 1 x daily - 7 x weekly - 3 sets - 10 reps   Access Code: [email or web address] URL: [email or web address] Date: 06/27/2022 Prepared by: Amalia Hailey  Exercises - Seated Ankle Alphabet  - 1 x daily - 7 x weekly - 3 sets - 10 reps - Standing Gastroc Stretch  - 1 x daily - 7 x weekly - 3 sets - 10 reps - Standing Ankle Dorsiflexion Stretch  - 1 x daily - 7 x weekly - 3 sets - 10 reps  ASSESSMENT:  CLINICAL IMPRESSION: Patient's goals assessed with patient demonstrating excellent progression towards functional measures. Her ROM and strength are significantly improving as well as a significant decrease in high pain levels. New goal addressing mobility added to POC. Patient agreeable to POC. Patient highly motivated for continuation of care and decreased pain. She will benefit from continued skilled OPPT services to address her limited right ankle, pain and difficulty with mobility to improve her overall quality of life, and to improve safety/Ind with functional mobility.   OBJECTIVE IMPAIRMENTS Abnormal gait, decreased activity tolerance, decreased balance, decreased mobility, difficulty walking, decreased ROM, decreased strength, increased edema, increased fascial restrictions, impaired flexibility, impaired sensation, and pain.   ACTIVITY LIMITATIONS sitting, standing, squatting, sleeping, stairs, transfers, bed mobility,  dressing, and locomotion level  PARTICIPATION LIMITATIONS: meal prep, cleaning, laundry, driving, shopping, community activity, occupation, and yard work  PERSONAL FACTORS Education, Past/current experiences, Time since onset of injury/illness/exacerbation, and 3+ comorbidities: HTN, obesity, general sensory neuropathy  are also affecting patient's functional outcome.   REHAB POTENTIAL: Good  CLINICAL DECISION MAKING: Stable/uncomplicated  EVALUATION COMPLEXITY: Moderate   GOALS: Goals reviewed with patient? Yes  SHORT TERM GOALS: Target date: 09/22/2022  Pt will improve FOTO score to 52% indicating improved disability score per this outcome measure. Baseline: 26% 10/10: 40%  Goal status: partially met     LONG TERM GOALS: Target date: 09/20/2022     Pt will report 1-2/10 pain at it's worst, to improve overall tolerance with ADL's, IADL's, and functional mobility. Baseline: can go up to 7/10 10/10: 4-5/10 Goal status: INITIAL  2.  Pt will improve gross R ankle strength to 4+/5, indicating improved strength for safe balance and gait without AD. Baseline: 3-/5 to 4/5 10/10: df 3+ eversion, inversion df 4+/5 Goal status: Partially Met   3.  Pt will improve gross R ankle AROM by at least 15deg, indicating improved ankle mobility required for safe and Ind gait. Baseline: 15deg from  neutral (DF), 10deg from neutral (PF), 20deg (IV), 10deg (EV); 10/10: DF 4 degrees PF WFL 55 Inversion: 40, eversion 15 degrees  Goal status: Partially MET  4.  Pt will be able to tolerate >5 min of static standing indicating improved safety and tolerance with upright functional mobility/IADL's. Baseline: <58mn before needing to move/sit 10/10: able to tolerate Goal status: MET  5.  Patient will be modified independent in walking on even/uneven surface with least restrictive assistive device, for 20+ minutes without rest break, reporting some difficulty or less to improve walking tolerance with  community ambulation including grocery shopping, going to church,etc.  Baseline: 10/10; unable Goal status: Initial   PLAN: PT FREQUENCY: 2x/week  PT DURATION: 6 weeks  PLANNED INTERVENTIONS: Therapeutic exercises, Therapeutic activity, Neuromuscular re-education, Balance training, Gait training, Patient/Family education, Self Care, Joint mobilization, Joint manipulation, Stair training, DME instructions, Dry Needling, Electrical stimulation, Cryotherapy, Moist heat, Splintting, Taping, Traction, Ultrasound, Ionotophoresis 478mml Dexamethasone, Manual therapy, and Re-evaluation  PLAN FOR NEXT SESSION: review original HEP and assure appropriate performance pacing, attempt strength interventions without pain provocation. Reassessment and decide whether to refer back to podiatry or continue with PT. Pt has a 30VL annually.     Patient Details  Name: Olivia Bass: [id number] of Birth: 11/08/22/1962eferring Provider:  FoSamara DeistDPM  Encounter Date: 09/01/2022   JoGwenlyn SaranPT, DPT Physical Therapist- [facility]11/02/23, 8:38 AM   CoHilltopAIN REEnd[facility] Ni[facility] LLCERVICES [address] StreetdNaselleNC[place]hone: [phone] Fax:  [phone]

## 2022-09-06 ENCOUNTER — Ambulatory Visit: Payer: BC Managed Care – PPO | Admitting: Physical Therapy

## 2022-09-08 ENCOUNTER — Encounter: Payer: BC Managed Care – PPO | Admitting: Physical Therapy

## 2022-09-13 ENCOUNTER — Encounter: Payer: BC Managed Care – PPO | Admitting: Physical Therapy

## 2022-09-13 DIAGNOSIS — R0602 Shortness of breath: Secondary | ICD-10-CM | POA: Diagnosis not present

## 2022-09-13 DIAGNOSIS — M542 Cervicalgia: Secondary | ICD-10-CM | POA: Diagnosis not present

## 2022-09-13 DIAGNOSIS — R0789 Other chest pain: Secondary | ICD-10-CM | POA: Diagnosis not present

## 2022-09-13 DIAGNOSIS — E669 Obesity, unspecified: Secondary | ICD-10-CM | POA: Diagnosis not present

## 2022-09-15 ENCOUNTER — Encounter: Payer: BC Managed Care – PPO | Admitting: Physical Therapy

## 2022-10-05 DIAGNOSIS — R0602 Shortness of breath: Secondary | ICD-10-CM | POA: Diagnosis not present

## 2022-10-05 DIAGNOSIS — R079 Chest pain, unspecified: Secondary | ICD-10-CM | POA: Diagnosis not present

## 2022-10-12 DIAGNOSIS — R0789 Other chest pain: Secondary | ICD-10-CM | POA: Diagnosis not present

## 2022-10-12 DIAGNOSIS — E669 Obesity, unspecified: Secondary | ICD-10-CM | POA: Diagnosis not present

## 2022-10-12 DIAGNOSIS — R002 Palpitations: Secondary | ICD-10-CM | POA: Diagnosis not present

## 2022-10-12 DIAGNOSIS — R0602 Shortness of breath: Secondary | ICD-10-CM | POA: Diagnosis not present

## 2022-10-13 DIAGNOSIS — J0100 Acute maxillary sinusitis, unspecified: Secondary | ICD-10-CM | POA: Diagnosis not present

## 2022-10-17 DIAGNOSIS — H2513 Age-related nuclear cataract, bilateral: Secondary | ICD-10-CM | POA: Diagnosis not present

## 2022-10-17 DIAGNOSIS — H179 Unspecified corneal scar and opacity: Secondary | ICD-10-CM | POA: Diagnosis not present

## 2022-10-17 DIAGNOSIS — H40001 Preglaucoma, unspecified, right eye: Secondary | ICD-10-CM | POA: Diagnosis not present

## 2022-10-17 DIAGNOSIS — H401121 Primary open-angle glaucoma, left eye, mild stage: Secondary | ICD-10-CM | POA: Diagnosis not present

## 2022-11-09 DIAGNOSIS — R42 Dizziness and giddiness: Secondary | ICD-10-CM | POA: Diagnosis not present

## 2022-11-09 DIAGNOSIS — R002 Palpitations: Secondary | ICD-10-CM | POA: Diagnosis not present

## 2022-11-09 DIAGNOSIS — R0789 Other chest pain: Secondary | ICD-10-CM | POA: Diagnosis not present

## 2022-11-09 DIAGNOSIS — E669 Obesity, unspecified: Secondary | ICD-10-CM | POA: Diagnosis not present

## 2022-11-09 DIAGNOSIS — Z1152 Encounter for screening for COVID-19: Secondary | ICD-10-CM | POA: Diagnosis not present

## 2022-11-09 DIAGNOSIS — F419 Anxiety disorder, unspecified: Secondary | ICD-10-CM | POA: Diagnosis not present

## 2022-11-09 DIAGNOSIS — R001 Bradycardia, unspecified: Secondary | ICD-10-CM | POA: Diagnosis not present

## 2022-11-09 DIAGNOSIS — Z79899 Other long term (current) drug therapy: Secondary | ICD-10-CM | POA: Diagnosis not present

## 2022-11-09 DIAGNOSIS — J329 Chronic sinusitis, unspecified: Secondary | ICD-10-CM | POA: Diagnosis not present

## 2022-11-09 DIAGNOSIS — Z880 Allergy status to penicillin: Secondary | ICD-10-CM | POA: Diagnosis not present

## 2022-11-09 DIAGNOSIS — H9201 Otalgia, right ear: Secondary | ICD-10-CM | POA: Diagnosis not present

## 2022-11-09 DIAGNOSIS — R519 Headache, unspecified: Secondary | ICD-10-CM | POA: Diagnosis not present

## 2022-11-09 DIAGNOSIS — Z882 Allergy status to sulfonamides status: Secondary | ICD-10-CM | POA: Diagnosis not present

## 2022-11-10 DIAGNOSIS — J329 Chronic sinusitis, unspecified: Secondary | ICD-10-CM | POA: Diagnosis not present

## 2022-12-06 DIAGNOSIS — R0602 Shortness of breath: Secondary | ICD-10-CM | POA: Diagnosis not present

## 2022-12-06 DIAGNOSIS — R0789 Other chest pain: Secondary | ICD-10-CM | POA: Diagnosis not present

## 2022-12-06 DIAGNOSIS — E782 Mixed hyperlipidemia: Secondary | ICD-10-CM | POA: Diagnosis not present

## 2022-12-06 DIAGNOSIS — R002 Palpitations: Secondary | ICD-10-CM | POA: Diagnosis not present

## 2023-03-02 DIAGNOSIS — E538 Deficiency of other specified B group vitamins: Secondary | ICD-10-CM | POA: Diagnosis not present

## 2023-03-02 DIAGNOSIS — M19041 Primary osteoarthritis, right hand: Secondary | ICD-10-CM | POA: Diagnosis not present

## 2023-03-02 DIAGNOSIS — M2550 Pain in unspecified joint: Secondary | ICD-10-CM | POA: Diagnosis not present

## 2023-03-02 DIAGNOSIS — G5712 Meralgia paresthetica, left lower limb: Secondary | ICD-10-CM | POA: Diagnosis not present

## 2023-03-02 DIAGNOSIS — R768 Other specified abnormal immunological findings in serum: Secondary | ICD-10-CM | POA: Diagnosis not present

## 2023-03-02 DIAGNOSIS — M130 Polyarthritis, unspecified: Secondary | ICD-10-CM | POA: Diagnosis not present

## 2023-03-02 DIAGNOSIS — M1812 Unilateral primary osteoarthritis of first carpometacarpal joint, left hand: Secondary | ICD-10-CM | POA: Diagnosis not present

## 2023-05-10 ENCOUNTER — Telehealth: Payer: Self-pay | Admitting: Obstetrics and Gynecology

## 2023-05-10 NOTE — Telephone Encounter (Signed)
That's fine. But why are we taking pt who hasn't been seen in 6 yrs since we're not taking NP?

## 2023-05-10 NOTE — Telephone Encounter (Signed)
This patient has called to scheduled an annual exam with Alicia Copland. She is an past patient last since in 2018. We have her scheduled for 07/06/23 at 1:15 pm (40). The patient has been taking care of an elderly aunt whom has recently passed, for the pass several years so she has put her own care on hold. I have added additional time (40) because the patient has concerns on about pelvic pain, concerns about ovarian cyst, and menopause she has gone thru.

## 2023-05-23 DIAGNOSIS — H903 Sensorineural hearing loss, bilateral: Secondary | ICD-10-CM | POA: Diagnosis not present

## 2023-05-23 DIAGNOSIS — M26602 Left temporomandibular joint disorder, unspecified: Secondary | ICD-10-CM | POA: Diagnosis not present

## 2023-05-23 DIAGNOSIS — H6122 Impacted cerumen, left ear: Secondary | ICD-10-CM | POA: Diagnosis not present

## 2023-06-07 DIAGNOSIS — I10 Essential (primary) hypertension: Secondary | ICD-10-CM | POA: Diagnosis not present

## 2023-06-07 DIAGNOSIS — R0789 Other chest pain: Secondary | ICD-10-CM | POA: Diagnosis not present

## 2023-06-07 DIAGNOSIS — E782 Mixed hyperlipidemia: Secondary | ICD-10-CM | POA: Diagnosis not present

## 2023-06-20 DIAGNOSIS — H401121 Primary open-angle glaucoma, left eye, mild stage: Secondary | ICD-10-CM | POA: Diagnosis not present

## 2023-07-04 NOTE — Progress Notes (Unsigned)
PCP: Barbette Reichmann, MD   No chief complaint on file.   HPI:      [name] is a 62 y.o. G1P0010 whose LMP was No LMP recorded (lmp unknown). Patient is postmenopausal., presents today for her annual examination.  Her menses are {norm/abn:715}, lasting {number: 22536} days.  Dysmenorrhea {dysmen:716}. She {does:18564} have intermenstrual bleeding. She {does:18564} have vasomotor sx.   Sex activity: {sex active: 315163}. She {does:18564} have vaginal dryness.  Last Pap: ? 2017 per pt report in prior visit; Results were: {norm/abn:16707::"no abnormalities"} /neg HPV DNA.  Hx of STDs: {STD hx:14358} S/p cryotx in distant past  Last mammogram: 10/04/17 at [facility]:*** Results were: normal--routine follow-up in 12 months There is no FH of breast cancer. There is no FH of ovarian cancer. The patient {does:18564} do self-breast exams.  Colonoscopy: {hx:15363}  Repeat due after 10*** years.   Tobacco use: {tob:20664} Alcohol use: {Alcohol:11675} No drug use Exercise: {exercise:31265}  She {does:18564} get adequate calcium and Vitamin D in her diet.  Labs with PCP.   There are no problems to display for this patient.   Past Surgical History:  Procedure Laterality Date   DILATION AND CURETTAGE OF UTERUS     age 75 - miscarriage   metal plate in right arm     TONSILLECTOMY     TONSILLECTOMY     WISDOM TOOTH EXTRACTION     age 51    Family History  Problem Relation Age of Onset   Diabetes Mother    Hypertension Mother    Arrhythmia Mother    Kidney disease Mother    Glaucoma Mother    Cancer Father 6       lumphoma, lung, throat   Anemia Sister    Breast cancer Maternal Aunt        37   Cancer Maternal Aunt 50       uterine tumor    Social History   Socioeconomic History   Marital status: Single    Spouse name: Not on file   Number of children: Not on file   Years of education: Not on file   Highest education level: Not on file  Occupational History    Not on file  Tobacco Use   Smoking status: Never   Smokeless tobacco: Never  Vaping Use   Vaping status: Never Used  Substance and Sexual Activity   Alcohol use: Yes    Alcohol/week: 0.0 standard drinks of alcohol    Comment: rarely   Drug use: No   Sexual activity: Never    Birth control/protection: Post-menopausal  Other Topics Concern   Not on file  Social History Narrative   Not on file   Social Determinants of Health   Financial Resource Strain: Not on file  Food Insecurity: Not on file  Transportation Needs: Not on file  Physical Activity: Not on file  Stress: Not on file  Social Connections: Not on file  Intimate Partner Violence: Not on file     Current Outpatient Medications:    acetaminophen (TYLENOL) 325 MG tablet, Take 650 mg by mouth every 6 (six) hours as needed for moderate pain. (Patient not taking: No sig reported), Disp: , Rfl:    Alpha-Lipoic Acid 600 MG TABS, Take by mouth., Disp: , Rfl:    Cyanocobalamin (B-12) 3000 MCG CAPS, Take by mouth. (Patient not taking: No sig reported), Disp: , Rfl:    Multiple Vitamin (MULTIVITAMIN) tablet, Take 1 tablet by mouth daily. (Patient  not taking: No sig reported), Disp: , Rfl:    phenazopyridine (PYRIDIUM) 200 MG tablet, Take 1 tablet (200 mg total) by mouth 3 (three) times daily., Disp: 6 tablet, Rfl: 0   Vitamin D, Ergocalciferol, (DRISDOL) 1.25 MG (50000 UNIT) CAPS capsule, Take 50,000 Units by mouth once a week. (Patient not taking: No sig reported), Disp: , Rfl:      ROS:  Review of Systems BREAST: No symptoms    Objective: LMP  (LMP Unknown)    OBGyn Exam  Results: No results found for this or any previous visit (from the past 24 hour(s)).  Assessment/Plan:  No diagnosis found.   No orders of the defined types were placed in this encounter.           GYN counsel {counseling: 16159}    F/U  No follow-ups on file.  Janetta Vandoren B. Kla Bily, PA-C 07/04/2023 4:55 PM

## 2023-07-06 ENCOUNTER — Other Ambulatory Visit (HOSPITAL_COMMUNITY)
Admission: RE | Admit: 2023-07-06 | Discharge: 2023-07-06 | Disposition: A | Discharge status: Home / Self Care | Payer: BC Managed Care – PPO | Source: Ambulatory Visit | Attending: Obstetrics and Gynecology | Admitting: Obstetrics and Gynecology

## 2023-07-06 ENCOUNTER — Encounter: Payer: Self-pay | Admitting: Obstetrics and Gynecology

## 2023-07-06 ENCOUNTER — Ambulatory Visit (INDEPENDENT_AMBULATORY_CARE_PROVIDER_SITE_OTHER): Payer: BC Managed Care – PPO | Admitting: Obstetrics and Gynecology

## 2023-07-06 VITALS — BP 118/80 | Ht 65.0 in | Wt 218.0 lb

## 2023-07-06 DIAGNOSIS — Z01419 Encounter for gynecological examination (general) (routine) without abnormal findings: Secondary | ICD-10-CM | POA: Diagnosis not present

## 2023-07-06 DIAGNOSIS — Z124 Encounter for screening for malignant neoplasm of cervix: Secondary | ICD-10-CM

## 2023-07-06 DIAGNOSIS — Z1151 Encounter for screening for human papillomavirus (HPV): Secondary | ICD-10-CM | POA: Insufficient documentation

## 2023-07-06 DIAGNOSIS — Z1231 Encounter for screening mammogram for malignant neoplasm of breast: Secondary | ICD-10-CM

## 2023-07-06 DIAGNOSIS — Z1211 Encounter for screening for malignant neoplasm of colon: Secondary | ICD-10-CM

## 2023-07-06 DIAGNOSIS — N9089 Other specified noninflammatory disorders of vulva and perineum: Secondary | ICD-10-CM

## 2023-07-06 NOTE — Patient Instructions (Addendum)
I value your feedback and you entrusting us with your care. If you get a [facility] patient survey, I would appreciate you taking the time to let us know about your experience today. Thank you!  [facility]: [phone]   [facility]: [phone]    

## 2023-07-10 LAB — CYTOLOGY - PAP
Comment: NEGATIVE
Diagnosis: NEGATIVE
High risk HPV: NEGATIVE

## 2023-07-27 ENCOUNTER — Ambulatory Visit: Payer: BC Managed Care – PPO | Admitting: Obstetrics and Gynecology

## 2023-11-02 DIAGNOSIS — H1033 Unspecified acute conjunctivitis, bilateral: Secondary | ICD-10-CM | POA: Diagnosis not present

## 2023-11-08 DIAGNOSIS — H35372 Puckering of macula, left eye: Secondary | ICD-10-CM | POA: Diagnosis not present

## 2023-11-08 DIAGNOSIS — H532 Diplopia: Secondary | ICD-10-CM | POA: Diagnosis not present

## 2023-11-08 DIAGNOSIS — H40021 Open angle with borderline findings, high risk, right eye: Secondary | ICD-10-CM | POA: Diagnosis not present

## 2023-11-08 DIAGNOSIS — H401121 Primary open-angle glaucoma, left eye, mild stage: Secondary | ICD-10-CM | POA: Diagnosis not present

## 2023-11-09 DIAGNOSIS — H2513 Age-related nuclear cataract, bilateral: Secondary | ICD-10-CM | POA: Diagnosis not present

## 2023-11-09 DIAGNOSIS — H401121 Primary open-angle glaucoma, left eye, mild stage: Secondary | ICD-10-CM | POA: Diagnosis not present

## 2023-11-09 DIAGNOSIS — H532 Diplopia: Secondary | ICD-10-CM | POA: Diagnosis not present

## 2023-11-09 DIAGNOSIS — H40021 Open angle with borderline findings, high risk, right eye: Secondary | ICD-10-CM | POA: Diagnosis not present

## 2023-12-26 DIAGNOSIS — H40021 Open angle with borderline findings, high risk, right eye: Secondary | ICD-10-CM | POA: Diagnosis not present

## 2023-12-26 DIAGNOSIS — H401121 Primary open-angle glaucoma, left eye, mild stage: Secondary | ICD-10-CM | POA: Diagnosis not present

## 2023-12-26 DIAGNOSIS — H2513 Age-related nuclear cataract, bilateral: Secondary | ICD-10-CM | POA: Diagnosis not present

## 2023-12-26 NOTE — Progress Notes (Signed)
 37 y old f with PMH migraines, anxiety, obesity, referred for elevated IOP   POH K abrasion  Amblyopia OD High myopia  - positive FHx glaucoma, no eye trauma, no steroid use - Pachy 650 OD, 629 OS - Gonio CBB OD, CBB OS - IOP 24 OD, 29 OS  - Nerve exam mild tilting, cupping OS > OD -  HVF 24-2 shows reduced foveal threshold OU, non specific defects - OCT RNFL shows borderline IT thinning OD, more IT thinning OS.  Diagnoses and all orders for this visit:  Primary open angle glaucoma (POAG) of left eye, mild stage  OAG (open angle glaucoma) suspect, high risk, right  Nuclear age-related cataract, both eyes        Mild POAG OS, glaucoma suspect OD -high myopia and tilted nerves may explain RNFL thinning, or could be due to glaucomatous loss -IOPs elevated OU, but has thick corneas -no VF loss -Options: SLT vs latanoprost OS- pt prefers SLT OS and when sister here in jan/Feb  2. Age related nuclear cataract OU- mild follow  3. Amblyopia OD- stable  12/04/17: Here for SLT OS, consent obtained Tolerated procedure well IOP post laser acceptable  12/08/17 Urgent walk-in for pain OS  Evidence of possible very small healing ED Tr AC rxn (sx may also be inflammatory) Will treat surface, if sx not improved will add PF  (pt reports a hx of recurrent K-abrasion--possibly recurrent erosions--pt may benefit from [facility] on the longterm)   01/22/18 Urgent walk-in for 2 episodes of temporal blackouts lasting 5 minutes within one week, ?flashes light when closing eyes, longstanding occipital headache VA stable, IOP stabke, no signs of RT, RD on exam Pt seen post dilation  ? atypical transient ischemic attack or ?retinal vasospasm discussed with neuro-ophthalmology (Dr Bing) who agrees to initiate work-up - carotid ultrasound - cardiac echo - MRI brain without contrast - lab: CBC, ESR, CRP  Letter sent to PCP for workup (Dr Cy)  02/05/18 Pt returned for f/u IOP dong well Had  sx of amaurosis OS. DFE without retinal pathology  Rec above w/u. Will send copies to Dr Cy Dr. Bosie Pt informed to return if sx recurs. If negative work-up and sx happen again, will organize apt with neuro-ophtho   06/12/18  PVD OS. NO RD OR tears, negative Shafer, RD precautions given   IOP fluctuant but overall stable   10/16/18 DFE stable IOP above goal.  Add latanoprost OS qhs  12/04/18 IOP better at goal. CPM  09/14/20 Lost to f.u. - caring for aunt and covid IOP stable Encouraged use of lat OS qhs  RTC 4 m IOP  07/08/2022 LTFU. Aunt recently passed, had been very busy  IOP too high at 24/29 off of meds. Restart latan OU New Mrx.  08/11/22 IOP improved but having irritation with Lat Start artif tears qid Switch to trav Z OU qhs  10/17/22 IOP stable  CPM  06/20/23 IOP 20/19. Not using gtt for 2 m HVF full. Restart Trav OU qhs  11/08/23 Pt LTFU IOP 22/19 reports didn't use in 2-3 d. Discussed need for compliance and to restart Trav OU qhs  Pt reports 2 weeks of pain OS- likely recurrent erosion OS. Start EES ung bid x 1 wk and muro 128 gtt qid.    Intermittent diplopia. Has ET on exam today. Pt recalls eye exercises as a child. Doesn't;t know if had an ET but suspect decomp ET in light of hx of amblyopia OD. Doesn;t appear  to have a high AC/A ratio. Refer to peds strabismus  12/26/23 IOP 15/14 Much improved on trav Awaiting strab eval but reports self patching has decreased diplopia.   RTC 15m IOP and HVF OU   Aunt Delores passed away 01-31-2024 Latanoprost - tearign and nasal stuffiness

## 2024-01-02 DIAGNOSIS — E782 Mixed hyperlipidemia: Secondary | ICD-10-CM | POA: Diagnosis not present

## 2024-01-02 DIAGNOSIS — R079 Chest pain, unspecified: Secondary | ICD-10-CM | POA: Diagnosis not present

## 2024-01-02 DIAGNOSIS — R002 Palpitations: Secondary | ICD-10-CM | POA: Diagnosis not present

## 2024-01-02 DIAGNOSIS — E66811 Obesity, class 1: Secondary | ICD-10-CM | POA: Diagnosis not present

## 2024-01-02 NOTE — Progress Notes (Signed)
 Established Patient Visit   Chief Complaint: Chief Complaint  Patient presents with  . Chest Pain    Occas   . Edema    Full body occas    Date of Service: 01/02/2024 Date of Birth: 10-27-1961 PCP: Provider  History of Present Illness: Olivia Bass is a 63 y.o.female patient with a past medical history of palpitations, atypical chest pain, hyperlipidemia.  Patient is here today for 77-month follow-up visit. Had previously been followed by Dr. Bosie.   Patient has a history of atypical chest pain. She has chest tightness at random not always associated with exercise or relieved by rest. She had evaluation of this chest pain last year including Myoview which had showed LBBB develops with exercise with symptoms of chest pain as well as septal hypoperfusion with stress consistent with ischemia versus LBBB.  Dr. Bosie had extensive conversation with patient about further evaluation including consideration for cardiac catheterization versus CT heart angiogram.  However, she has significant risk for invasive or noninvasive cardiac evaluation due to severe contrast allergy. Due to this, mutual decision was to pursue medical management.  Her echocardiogram in 10/05/2022 showed normal LV function with no significant valvular abnormalities, no wall motion abnormalities.  Her symptoms have continued but have not changed since this last evaluation.   She has experienced episodes of tachycardia, which she manages by resting. she tracks episodes with [employer]. EKGs by [employer] have correlated mostly with what appears to be normal sinus rhythm. This symptoms are stable at this time with no significant changes    Past Medical and Surgical History  Past Medical History Past Medical History:  Diagnosis Date  . Anxiety   . Excessive or frequent menstruation   . Migraine   . Ovarian cyst     Past Surgical History She has a past surgical history that includes Tonsillectomy and Tooth extraction.    Medications and Allergies  Current Medications  Current Outpatient Medications on File Prior to Visit  Medication Sig Dispense Refill  . aspirin 81 MG EC tablet Take 1 tablet (81 mg total) by mouth once daily (Patient taking differently: Take 81 mg by mouth as needed) 30 tablet 11  . FUROsemide (LASIX) 20 MG tablet Take 1 tablet (20 mg total) by mouth once daily (Patient taking differently: Take 20 mg by mouth as needed for Edema) 30 tablet 11  . nitroGLYcerin (NITROSTAT) 0.4 MG SL tablet PLACE 1 TABLET (0.4 MG TOTAL) UNDER THE TONGUE EVERY 5 MINUTES AS NEEDED FOR CHEST PAIN - MAY TAKE UP TO 3 DOSES. 25 tablet 0  . sodium chloride (MURO 128) 5 % ophthalmic solution Place 1 drop into the left eye 4 (four) times daily (Patient taking differently: Place 1 drop into both eyes once daily) 15 mL 12  . travoprost (TRAVATAN Z) 0.004 % Ophth ophthalmic solution Place 1 drop into both eyes at bedtime 10 mL 3   No current facility-administered medications on file prior to visit.    Allergies: Fish containing products, Iodinated contrast media, Mold, Other, Others, Others, Penicillins, Sulfa (sulfonamide antibiotics), and Erythromycin  Social and Family History  Social History  reports that she quit smoking about 31 years ago. Her smoking use included cigarettes. She started smoking about 36 years ago. She has a 1.3 pack-year smoking history. She has never used smokeless tobacco. She reports current alcohol use. She reports that she does not use drugs.  Family History Family History  Problem Relation Name Age of Onset  . Heart  disease Mother    . High blood pressure (Hypertension) Mother    . Diabetes type II Mother    . Glaucoma Mother    . Aneurysm Mother    . Coronary Artery Disease (Blocked arteries around heart) Mother    . Cancer Father    . Glaucoma Maternal Aunt      Review of Systems   Review of Systems  Positive for chest pain, palpitations  Negative for weight gain weight  loss, weakness, vision change, hearing loss, cough, congestion, PND, orthopnea, heartburn, nausea, diaphoresis, vomiting, diarrhea, bloody stool, melena, stomach pain, extremity pain, leg weakness, leg cramping, leg blood clots, headache, blackouts, nosebleed, trouble swallowing, mouth pain, urinary frequency, urination at night, muscle weakness, skin lesions, skin rashes, tingling ,ulcers, numbness, anxiety, and/or depression Physical Examination   Vitals:BP (!) 132/90 (BP Location: Left upper arm, Patient Position: Sitting, BP Cuff Size: Adult)   Pulse 65   Resp 15   Ht 166.4 cm (5' 5.5)   Wt 95.1 kg (209 lb 9.6 oz)   LMP  (LMP Unknown)   SpO2 98%   BMI 34.35 kg/m  Ht:166.4 cm (5' 5.5) Wt:95.1 kg (209 lb 9.6 oz) ADJ:Anib surface area is 2.1 meters squared. Body mass index is 34.35 kg/m. Appearance: well appearing in no acute distress HEENT: Pupils equally reactive to light and accomodation, no xanthalasma   Neck: Supple, no apparent thyromegaly, masses, or lymphadenopathy  Lungs: normal respiratory effort; no crackles, no rhonchi, no wheezes Heart: Regular rate and rhythm. Normal S1 S2  No gallops, murmur, no rub, PMI is normal size and placement. carotid upstroke normal without bruit. Jugular venous pressure is normal Abdomen: soft, nontender, not distended with normal bowel sounds. No apparent hepatosplenomegally. Abdominal aorta is normal size without bruit Extremities: no edema, no ulcers, no clubbing, no cyanosis Peripheral Pulses: 2+ in upper extremities, 2+ femoral pulses bilaterally, 2+lower extremity  Musculoskeletal;  Normal muscle tone without kyphosis Neurological:   Oriented and Alert, Cranial nerves intact  Assessment   63 y.o. female with  Encounter Diagnoses  Name Primary?  . Palpitations Yes  . Chest pain, unspecified type   . Mixed hyperlipidemia   . Obesity (BMI 30.0-34.9)   . Chest pain, atypical   . Prediabetes          Plan   1.  Chest pain:  Stable over the last several months with no significant changes recently.  Favoring medical management at this time. -Continue aspirin 81 mg daily -Continue pravastatin 20 mg daily -Consider addition of isosorbide if chest pain persists, patient defers additional therapy at this time  2.  Palpitations: Most consistent with history of PVCs.  Patient has not had any significant changes in symptoms at this time -Continue current medical management with no changes  3.  Hyperlipidemia:  -Continue Pravastatin 20 mg daily  4.  Hypertension: Stable today at 132/90, no previous elevated office readings -No change to current management    Orders Placed This Encounter  Procedures  . ECG 12-lead    Return in about 6 months (around 07/04/2024).   Attestation Statement:   I personally performed the service, non-incident to. (WP)   AMANDA MARIE [place], [place]

## 2024-06-12 ENCOUNTER — Encounter: Payer: Self-pay | Admitting: Student

## 2024-06-12 ENCOUNTER — Ambulatory Visit (INDEPENDENT_AMBULATORY_CARE_PROVIDER_SITE_OTHER): Admitting: Student

## 2024-06-12 VITALS — BP 138/88 | HR 83 | Ht 65.0 in | Wt 213.0 lb

## 2024-06-12 DIAGNOSIS — R600 Localized edema: Secondary | ICD-10-CM | POA: Insufficient documentation

## 2024-06-12 DIAGNOSIS — E782 Mixed hyperlipidemia: Secondary | ICD-10-CM | POA: Diagnosis not present

## 2024-06-12 DIAGNOSIS — I493 Ventricular premature depolarization: Secondary | ICD-10-CM | POA: Insufficient documentation

## 2024-06-12 DIAGNOSIS — M7502 Adhesive capsulitis of left shoulder: Secondary | ICD-10-CM

## 2024-06-12 DIAGNOSIS — R102 Pelvic and perineal pain: Secondary | ICD-10-CM | POA: Diagnosis not present

## 2024-06-12 DIAGNOSIS — R739 Hyperglycemia, unspecified: Secondary | ICD-10-CM | POA: Insufficient documentation

## 2024-06-12 DIAGNOSIS — R9439 Abnormal result of other cardiovascular function study: Secondary | ICD-10-CM

## 2024-06-12 DIAGNOSIS — E785 Hyperlipidemia, unspecified: Secondary | ICD-10-CM | POA: Insufficient documentation

## 2024-06-12 DIAGNOSIS — E1140 Type 2 diabetes mellitus with diabetic neuropathy, unspecified: Secondary | ICD-10-CM

## 2024-06-12 DIAGNOSIS — E669 Obesity, unspecified: Secondary | ICD-10-CM | POA: Insufficient documentation

## 2024-06-12 DIAGNOSIS — M546 Pain in thoracic spine: Secondary | ICD-10-CM

## 2024-06-12 MED ORDER — CYCLOBENZAPRINE HCL 5 MG PO TABS: 5.0000 mg | ORAL_TABLET | Freq: Three times a day (TID) | ORAL | 0 refills | Status: AC | PRN

## 2024-06-12 NOTE — Patient Instructions (Signed)
 Please try to avoid salty foods frequently

## 2024-06-12 NOTE — Assessment & Plan Note (Addendum)
 A1c 6.6% in 2023 no further A1c since then. Family history of diabetes in her mother, grandmother, and aunts. A1c today to assess for diabetes.

## 2024-06-12 NOTE — Progress Notes (Signed)
 New Patient Office Visit  Subjective    Patient ID: Olivia Bass, female    DOB: 1961/09/14  Age: 63 y.o. MRN: [id number]  CC:  Chief Complaint  Patient presents with   Establish Care    Patient is here today to establish care, was previously going to [facility] clinic    Back Pain    Patient is having back pain in one spot, describes pain as throbbing, stabbing, aching, going on for a few months     HPI Olivia Bass is a 63 year old person living with PVCs, LBBB, glaucoma  presents to establish care. Previously seeing Dr. Sadie at [facility].   Today has thoracic back pain and left shoulder pain. Worsening for the last few week. Thinks she slept wrong. Using lidocaine patches help some with the pain but feels overall progressively worsening. Denies recent injury, trama, or falls. Cannot take NSAIDs due to GI upset.   Outpatient Encounter Medications as of 06/12/2024  Medication Sig   cyclobenzaprine  (FLEXERIL ) 5 MG tablet Take 1 tablet (5 mg total) by mouth 3 (three) times daily as needed for muscle spasms.   furosemide (LASIX) 20 MG tablet Take by mouth as needed.   nitroGLYCERIN (NITROSTAT) 0.4 MG SL tablet Place 0.4 mg under the tongue every 5 (five) minutes as needed for chest pain.   pravastatin (PRAVACHOL) 20 MG tablet Take 20 mg by mouth daily.   Travoprost, BAK Free, (TRAVATAN) 0.004 % SOLN ophthalmic solution Apply to eye.   No facility-administered encounter medications on file as of 06/12/2024.    Past Medical History:  Diagnosis Date   Glaucoma    Kidney stones    Metal plates in right arm    montagia   UTI (urinary tract infection)     Past Surgical History:  Procedure Laterality Date   DILATION AND CURETTAGE OF UTERUS     age 59 - miscarriage   metal plate in right arm     TONSILLECTOMY     TONSILLECTOMY     WISDOM TOOTH EXTRACTION     age 49    Family History  Problem Relation Age of Onset   Diabetes Mother    Hypertension Mother     Arrhythmia Mother    Kidney disease Mother    Glaucoma Mother    Cancer Father 67       lumphoma, lung, throat   Anemia Sister    Diabetes Maternal Grandmother    Breast cancer Maternal Aunt        68   Cancer Maternal Aunt 50       uterine tumor    Social History   Socioeconomic History   Marital status: Single    Spouse name: Not on file   Number of children: Not on file   Years of education: Not on file   Highest education level: Not on file  Occupational History   Not on file  Tobacco Use   Smoking status: Never   Smokeless tobacco: Never  Vaping Use   Vaping status: Never Used  Substance and Sexual Activity   Alcohol use: Yes    Alcohol/week: 0.0 standard drinks of alcohol    Comment: rarely   Drug use: No   Sexual activity: Not Currently    Birth control/protection: Post-menopausal  Other Topics Concern   Not on file  Social History Narrative   Not on file   Social Drivers of Health   Financial Resource Strain: Not on  file  Food Insecurity: No Food Insecurity (06/12/2024)   Hunger Vital Sign    Worried About Running Out of Food in the Last Year: Never true    Ran Out of Food in the Last Year: Never true  Transportation Needs: No Transportation Needs (06/12/2024)   PRAPARE - Administrator, Civil Service (Medical): No    Lack of Transportation (Non-Medical): No  Physical Activity: Not on file  Stress: Not on file  Social Connections: Not on file  Intimate Partner Violence: Not At Risk (06/12/2024)   Humiliation, Afraid, Rape, and Kick questionnaire    Fear of Current or Ex-Partner: No    Emotionally Abused: No    Physically Abused: No    Sexually Abused: No    ROS Refer to HPI    Objective   BP 138/88   Pulse 83   Ht 5' 5 (1.651 m)   Wt 213 lb (96.6 kg)   LMP  (LMP Unknown)   SpO2 96%   BMI 35.45 kg/m   Physical Exam Constitutional:      Appearance: Normal appearance.  HENT:     Mouth/Throat:     Mouth: Mucous membranes  are moist.     Pharynx: Oropharynx is clear.  Cardiovascular:     Rate and Rhythm: Normal rate and regular rhythm.  Pulmonary:     Effort: Pulmonary effort is normal.     Breath sounds: No rhonchi or rales.  Abdominal:     General: Abdomen is flat. Bowel sounds are normal. There is no distension.     Palpations: Abdomen is soft.     Tenderness: There is no abdominal tenderness.  Musculoskeletal:     Right lower leg: No edema.     Left lower leg: No edema.     Comments: Decreased ROM of the left shoulder with flexion, abduction, and external rotation due to pain, normal strength, TTP of the thoracic paraspinal muscles  Skin:    General: Skin is warm and dry.     Capillary Refill: Capillary refill takes less than 2 seconds.  Neurological:     General: No focal deficit present.     Mental Status: She is alert and oriented to person, place, and time.  Psychiatric:        Mood and Affect: Mood normal.        Behavior: Behavior normal.        06/12/2024   11:52 AM  Depression screen PHQ 2/9  Decreased Interest 1  Down, Depressed, Hopeless 1  PHQ - 2 Score 2  Altered sleeping 1  Tired, decreased energy 0  Change in appetite 0  Feeling bad or failure about yourself  1  Trouble concentrating 0  Moving slowly or fidgety/restless 3  Suicidal thoughts 0  PHQ-9 Score 7  Difficult doing work/chores Somewhat difficult      06/12/2024   11:52 AM  GAD 7 : Generalized Anxiety Score  Nervous, Anxious, on Edge 0  Control/stop worrying 2  Worry too much - different things 0  Trouble relaxing 0  Restless 0  Easily annoyed or irritable 0  Afraid - awful might happen 1  Total GAD 7 Score 3  Anxiety Difficulty Somewhat difficult    Last CBC Lab Results  Component Value Date   WBC 7.6 03/13/2017   HGB 13.6 03/13/2017   HCT 40.3 03/13/2017   MCV 90 03/13/2017   MCH 30.4 03/13/2017   RDW 13.6 03/13/2017   PLT 221 03/13/2017  Last metabolic panel Lab Results  Component Value  Date   GLUCOSE 127 (H) 06/12/2024   NA 141 06/12/2024   K 4.5 06/12/2024   CL 107 (H) 06/12/2024   CO2 19 (L) 06/12/2024   BUN 14 06/12/2024   CREATININE 0.73 06/12/2024   EGFR 93 06/12/2024   CALCIUM  9.7 06/12/2024   PROT 7.1 03/13/2017   ALBUMIN 4.4 03/13/2017   LABGLOB 2.7 03/13/2017   AGRATIO 1.6 03/13/2017   BILITOT 0.8 03/13/2017   ALKPHOS 85 03/13/2017   AST 22 03/13/2017   ALT 26 03/13/2017   ANIONGAP 10 07/08/2012   Last lipids Lab Results  Component Value Date   CHOL 201 (H) 06/12/2024   HDL 51 06/12/2024   LDLCALC 133 (H) 06/12/2024   TRIG 92 06/12/2024   CHOLHDL 3.9 06/12/2024   Last hemoglobin A1c Lab Results  Component Value Date   HGBA1C 6.8 (H) 06/12/2024   Last thyroid functions Lab Results  Component Value Date   TSH 1.910 03/13/2017        Assessment & Plan:  Mixed hyperlipidemia Assessment & Plan: Currently on pravastatin 20 mg daily. Lipid panel today.   Orders: -     Lipid panel  PVC's (premature ventricular contractions) Assessment & Plan: Palpitations that come and go, not currently on medication aside from statin. Sees Cardiology for this.    Hyperglycemia Assessment & Plan: A1c 6.6% in 2023 no further A1c since then. Family history of diabetes in her mother, grandmother, and aunts. A1c today to assess for diabetes.    Leg edema Assessment & Plan: Report taking lasix 20mg  as needed for swelling in the body. Takes when close are feeling tight. Takes about 3-4 doses every 3-4 weeks. BMP today. Does like eating popcorn, discussed low salt diet.   Orders: -     Basic metabolic panel with GFR  Pelvic pain Assessment & Plan: History of simple R ovarian cyst in 2016. Reports left sided pelvic discomfort for years and would like to discuss with GYN. Was seen by [facility] but would like to go see a different office.     Orders: -     Ambulatory referral to Gynecology  Adhesive capsulitis of left shoulder Assessment &  Plan: Reports increase left shoulder pain for the past month or so, Has been sleeping on couch to take care of her dog. Also has associated left sided back pain with this. Cannot tolerate NSAIDS due to GI upset. Will refer to PT and cyclobenzaprine  as needed.   Orders: -     Ambulatory referral to Physical Therapy  Acute left-sided thoracic back pain -     Ambulatory referral to Physical Therapy  Type 2 diabetes mellitus with diabetic neuropathy, without long-term current use of insulin (HCC) Assessment & Plan: Elevated A1c in 2023 in diabetic range. Patient is not aware of having diabetes. Not currently on any medication for this. A1c today.   Orders: -     Hemoglobin A1c  Abnormal stress test Assessment & Plan: Followed by cardiology, unable to obtain cardiac cath due to severe contrast allergy. Managed medically with statin and ASA. She is not taking ASA daily. Encouraged her to take this daily.    Other orders -     Cyclobenzaprine  HCl; Take 1 tablet (5 mg total) by mouth 3 (three) times daily as needed for muscle spasms.  Dispense: 15 tablet; Refill: 0    Return in about 1 month (around 07/13/2024) for physical.   Harlene Saddler,  MD

## 2024-06-12 NOTE — Assessment & Plan Note (Addendum)
 Report taking lasix 20mg  as needed for swelling in the body. Takes when close are feeling tight. Takes about 3-4 doses every 3-4 weeks. BMP today. Does like eating popcorn, discussed low salt diet.

## 2024-06-12 NOTE — Assessment & Plan Note (Addendum)
 Palpitations that come and go, not currently on medication aside from statin. Sees Cardiology for this.

## 2024-06-12 NOTE — Assessment & Plan Note (Addendum)
 Currently on pravastatin 20 mg daily. Lipid panel today.

## 2024-06-13 ENCOUNTER — Ambulatory Visit: Payer: Self-pay | Admitting: Student

## 2024-06-13 DIAGNOSIS — R9439 Abnormal result of other cardiovascular function study: Secondary | ICD-10-CM | POA: Insufficient documentation

## 2024-06-13 DIAGNOSIS — R102 Pelvic and perineal pain: Secondary | ICD-10-CM | POA: Insufficient documentation

## 2024-06-13 DIAGNOSIS — R195 Other fecal abnormalities: Secondary | ICD-10-CM | POA: Insufficient documentation

## 2024-06-13 DIAGNOSIS — E782 Mixed hyperlipidemia: Secondary | ICD-10-CM

## 2024-06-13 DIAGNOSIS — M7500 Adhesive capsulitis of unspecified shoulder: Secondary | ICD-10-CM | POA: Insufficient documentation

## 2024-06-13 DIAGNOSIS — H409 Unspecified glaucoma: Secondary | ICD-10-CM | POA: Insufficient documentation

## 2024-06-13 DIAGNOSIS — E119 Type 2 diabetes mellitus without complications: Secondary | ICD-10-CM | POA: Insufficient documentation

## 2024-06-13 LAB — BASIC METABOLIC PANEL WITH GFR
BUN/Creatinine Ratio: 19 (ref 12–28)
BUN: 14 mg/dL (ref 8–27)
CO2: 19 mmol/L — ABNORMAL LOW (ref 20–29)
Calcium: 9.7 mg/dL (ref 8.7–10.3)
Chloride: 107 mmol/L — ABNORMAL HIGH (ref 96–106)
Creatinine, Ser: 0.73 mg/dL (ref 0.57–1.00)
Glucose: 127 mg/dL — ABNORMAL HIGH (ref 70–99)
Potassium: 4.5 mmol/L (ref 3.5–5.2)
Sodium: 141 mmol/L (ref 134–144)
eGFR: 93 mL/min/1.73 (ref >59)

## 2024-06-13 LAB — LIPID PANEL
Chol/HDL Ratio: 3.9 ratio (ref 0.0–4.4)
Cholesterol, Total: 201 mg/dL — ABNORMAL HIGH (ref 100–199)
HDL: 51 mg/dL (ref >39)
LDL Chol Calc (NIH): 133 mg/dL — ABNORMAL HIGH (ref 0–99)
Triglycerides: 92 mg/dL (ref 0–149)
VLDL Cholesterol Cal: 17 mg/dL (ref 5–40)

## 2024-06-13 LAB — HEMOGLOBIN A1C
Est. average glucose Bld gHb Est-mCnc: 148 mg/dL
Hgb A1c MFr Bld: 6.8 % — ABNORMAL HIGH (ref 4.8–5.6)

## 2024-06-13 MED ORDER — ROSUVASTATIN CALCIUM 10 MG PO TABS
10.0000 mg | ORAL_TABLET | Freq: Every day | ORAL | 0 refills | Status: DC
Start: 2024-06-13 — End: 2024-07-22

## 2024-06-13 NOTE — Assessment & Plan Note (Deleted)
 03/17/2022 NM myocardial perfusion SPECT multiple  Normal lv function  LBBB develops with exercise with symptoms of chest pain  Septal hypoperfusion with stress consistant with ischemia vs lbbb.

## 2024-06-13 NOTE — Telephone Encounter (Signed)
 Copied from CRM [id number]. Topic: Clinical - Medication Question >> Jun 13, 2024  3:38 PM Nathanel BROCKS wrote: Reason for CRM: pt states that dr recommends that she go on Crestor  and she is willing to give it a try. Please call that in.  [facility] [address] [place] - [address]  Phone: [phone] Fax: [phone]

## 2024-06-13 NOTE — Assessment & Plan Note (Addendum)
 Reports increase left shoulder pain for the past month or so, Has been sleeping on couch to take care of her dog. Also has associated left sided back pain with this. Cannot tolerate NSAIDS due to GI upset. Will refer to PT and cyclobenzaprine  as needed.

## 2024-06-13 NOTE — Assessment & Plan Note (Addendum)
 History of simple R ovarian cyst in 2016. Reports left sided pelvic discomfort for years and would like to discuss with GYN. Was seen by [facility] but would like to go see a different office.

## 2024-06-13 NOTE — Assessment & Plan Note (Signed)
 Followed by cardiology, unable to obtain cardiac cath due to severe contrast allergy. Managed medically with statin and ASA. She is not taking ASA daily. Encouraged her to take this daily.

## 2024-06-13 NOTE — Assessment & Plan Note (Signed)
 Elevated A1c in 2023 in diabetic range. Patient is not aware of having diabetes. Not currently on any medication for this. A1c today.

## 2024-06-27 DIAGNOSIS — Z01419 Encounter for gynecological examination (general) (routine) without abnormal findings: Secondary | ICD-10-CM | POA: Diagnosis not present

## 2024-07-04 ENCOUNTER — Ambulatory Visit: Payer: Self-pay

## 2024-07-04 NOTE — Telephone Encounter (Signed)
 FYI Only or Action Required?: Action required by provider: clinical question for provider.  Patient was last seen in primary care on 06/12/2024 by Lemon Raisin, MD.  Called Nurse Triage reporting Medication Reaction.  Symptoms began a week ago.  Interventions attempted: Rest, hydration, or home remedies.  Symptoms are: unchanged.  Triage Disposition: See PCP When Office is Open (Within 3 Days)  Patient/caregiver understands and will follow disposition?: No, wishes to speak with PCP Reason for Disposition  [1] Palpitations AND [2] no improvement after using Care Advice  Answer Assessment - Initial Assessment Questions Happened before taking Pravastatin, now this is more intense. Patient stated trying to work while talking to me. Denying further triaging. Wants to let PCP know she wants to stop taking the Rosuvastatin . States she'll make appointment with cardiology and stop taking Rosuvastatin . Please advise.   1. DESCRIPTION: Please describe your heart rate or heartbeat that you are having (e.g., fast/slow, regular/irregular, skipped or extra beats, palpitations)     Feeling an extra beat   2. ONSET: When did it start? (e.g., minutes, hours, days)      Started Rosuvastatin  on 8/14, noticed 2 weeks after  3. DURATION: How long does it last (e.g., seconds, minutes, hours)     lasts awhile  4. PATTERN Does it come and go, or has it been constant since it started?  Does it get worse with exertion?   Are you feeling it now?     Comes and goes   8. CAUSE: What do you think is causing the palpitations?     Rosuvastatin    10. OTHER SYMPTOMS: Do you have any other symptoms? (e.g., dizziness, chest pain, sweating, difficulty breathing)       Patient stated I can't, I'm sorry  Protocols used: Heart Rate and Heartbeat Questions-A-AH  Copied from CRM [id number]. Topic: Clinical - Red Word Triage >> Jul 04, 2024 11:53 AM Selinda RAMAN wrote: Red Word that prompted transfer  to Nurse Triage: The patient called in stating she believes she is having some type of reaction from her rosuvastatin  (CRESTOR ) 10 MG tablet. She says she just doesn't feel right and says her heart feels like there is an extra beat. She wants to talk with a nurse to see if it would just be best to go on the previous medication she was taking which she still has some of. I will transfer her to E2C2 NT.

## 2024-07-04 NOTE — Telephone Encounter (Signed)
 Called pt went over message from Dr. Lemon. She verbalized understanding.  KP

## 2024-07-04 NOTE — Telephone Encounter (Signed)
 FYI  KP

## 2024-07-18 DIAGNOSIS — H2513 Age-related nuclear cataract, bilateral: Secondary | ICD-10-CM | POA: Diagnosis not present

## 2024-07-18 DIAGNOSIS — H5319 Other subjective visual disturbances: Secondary | ICD-10-CM | POA: Diagnosis not present

## 2024-07-18 DIAGNOSIS — H401121 Primary open-angle glaucoma, left eye, mild stage: Secondary | ICD-10-CM | POA: Diagnosis not present

## 2024-07-18 DIAGNOSIS — H40021 Open angle with borderline findings, high risk, right eye: Secondary | ICD-10-CM | POA: Diagnosis not present

## 2024-07-19 ENCOUNTER — Encounter: Payer: Self-pay | Admitting: Student

## 2024-07-19 ENCOUNTER — Ambulatory Visit (INDEPENDENT_AMBULATORY_CARE_PROVIDER_SITE_OTHER): Admitting: Student

## 2024-07-19 VITALS — BP 138/88 | HR 60 | Ht 65.0 in | Wt 208.0 lb

## 2024-07-19 DIAGNOSIS — E66812 Obesity, class 2: Secondary | ICD-10-CM

## 2024-07-19 DIAGNOSIS — R600 Localized edema: Secondary | ICD-10-CM

## 2024-07-19 DIAGNOSIS — Z7984 Long term (current) use of oral hypoglycemic drugs: Secondary | ICD-10-CM

## 2024-07-19 DIAGNOSIS — R195 Other fecal abnormalities: Secondary | ICD-10-CM

## 2024-07-19 DIAGNOSIS — E1140 Type 2 diabetes mellitus with diabetic neuropathy, unspecified: Secondary | ICD-10-CM | POA: Diagnosis not present

## 2024-07-19 DIAGNOSIS — Z6835 Body mass index (BMI) 35.0-35.9, adult: Secondary | ICD-10-CM

## 2024-07-19 DIAGNOSIS — Z0000 Encounter for general adult medical examination without abnormal findings: Secondary | ICD-10-CM | POA: Diagnosis not present

## 2024-07-19 DIAGNOSIS — E782 Mixed hyperlipidemia: Secondary | ICD-10-CM

## 2024-07-19 MED ORDER — PRAVASTATIN SODIUM 20 MG PO TABS: 20.0000 mg | ORAL_TABLET | Freq: Every day | ORAL | 1 refills | Status: AC

## 2024-07-19 MED ORDER — METFORMIN HCL ER 500 MG PO TB24: 500.0000 mg | ORAL_TABLET | Freq: Two times a day (BID) | ORAL | 1 refills | Status: AC

## 2024-07-19 NOTE — Assessment & Plan Note (Addendum)
 Has been checking fingersticks on average 130-140. Start metformin  500 mg twice daily.

## 2024-07-19 NOTE — Progress Notes (Signed)
 Complete physical exam  Patient: Olivia Bass   DOB: 24-Dec-1960   63 y.o. Female  MRN: [id number]  Subjective:    Chief Complaint  Patient presents with   Annual Exam    Olivia Bass is a 63 y.o. female who presents today for a complete physical exam. She reports consuming a general diet. Home exercise routine includes treadmill. She generally feels well. She reports sleeping well. She does not have additional problems to discuss today.   Vision:Within last year and sees glaucoma specialist, has not had an diabetic eye exam   Patient Active Problem List   Diagnosis Date Noted   Annual physical exam 07/22/2024   Diabetes (HCC) 06/13/2024   Adhesive capsulitis 06/13/2024   Abnormal stress test 06/13/2024   Glaucoma 06/13/2024   Pelvic pain 06/13/2024   Positive colorectal cancer screening using Cologuard test 06/13/2024   HLD (hyperlipidemia) 06/12/2024   PVC's (premature ventricular contractions) 06/12/2024   Obesity 06/12/2024   Leg edema 06/12/2024      Patient Care Team: Lemon Raisin, MD as PCP - General (Internal Medicine)   Outpatient Medications Prior to Visit  Medication Sig   nitroGLYCERIN (NITROSTAT) 0.4 MG SL tablet Place 0.4 mg under the tongue every 5 (five) minutes as needed for chest pain.   Travoprost, BAK Free, (TRAVATAN) 0.004 % SOLN ophthalmic solution Place 1 drop into both eyes at bedtime.   cyclobenzaprine  (FLEXERIL ) 5 MG tablet Take 1 tablet (5 mg total) by mouth 3 (three) times daily as needed for muscle spasms. (Patient not taking: Reported on 07/19/2024)   furosemide (LASIX) 20 MG tablet Take by mouth as needed. (Patient not taking: Reported on 07/19/2024)   [DISCONTINUED] rosuvastatin  (CRESTOR ) 10 MG tablet Take 1 tablet (10 mg total) by mouth daily. (Patient not taking: Reported on 07/19/2024)   No facility-administered medications prior to visit.    ROS Refer to HPI     Objective:    BP 138/88   Pulse 60   Ht 5' 5 (1.651 m)   Wt  208 lb (94.3 kg)   LMP  (LMP Unknown)   SpO2 95%   BMI 34.61 kg/m  BP Readings from Last 3 Encounters:  07/19/24 138/88  06/12/24 138/88  07/06/23 118/80    Physical Exam Constitutional:      Appearance: Normal appearance.  HENT:     Head: Normocephalic and atraumatic.     Right Ear: Tympanic membrane normal.     Left Ear: Tympanic membrane normal.     Mouth/Throat:     Mouth: Mucous membranes are moist.     Pharynx: Oropharynx is clear.  Cardiovascular:     Rate and Rhythm: Normal rate and regular rhythm.  Pulmonary:     Effort: Pulmonary effort is normal.     Breath sounds: No rhonchi or rales.  Abdominal:     General: Abdomen is flat. Bowel sounds are normal. There is no distension.     Palpations: Abdomen is soft.     Tenderness: There is no abdominal tenderness.  Musculoskeletal:        General: Normal range of motion.     Cervical back: No tenderness.     Right lower leg: No edema.     Left lower leg: No edema.  Skin:    General: Skin is warm and dry.     Capillary Refill: Capillary refill takes less than 2 seconds.  Neurological:     General: No focal deficit present.  Mental Status: She is alert and oriented to person, place, and time.  Psychiatric:        Mood and Affect: Mood normal.        Behavior: Behavior normal.         07/19/2024   11:00 AM 06/12/2024   11:52 AM  Depression screen PHQ 2/9  Decreased Interest 2 1  Down, Depressed, Hopeless 2 1  PHQ - 2 Score 4 2  Altered sleeping 0 1  Tired, decreased energy 0 0  Change in appetite 0 0  Feeling bad or failure about yourself  0 1  Trouble concentrating 0 0  Moving slowly or fidgety/restless 0 3  Suicidal thoughts 0 0  PHQ-9 Score 4 7  Difficult doing work/chores Not difficult at all Somewhat difficult      07/19/2024   11:00 AM 06/12/2024   11:52 AM  GAD 7 : Generalized Anxiety Score  Nervous, Anxious, on Edge 0 0  Control/stop worrying 2 2  Worry too much - different things 2 0   Trouble relaxing 0 0  Restless 0 0  Easily annoyed or irritable 0 0  Afraid - awful might happen 0 1  Total GAD 7 Score 4 3  Anxiety Difficulty Not difficult at all Somewhat difficult    Results for orders placed or performed in visit on 07/19/24  Comprehensive Metabolic Panel (CMET)  Result Value Ref Range   Glucose 114 (H) 70 - 99 mg/dL   BUN 12 8 - 27 mg/dL   Creatinine, Ser 9.14 0.57 - 1.00 mg/dL   eGFR 77 >40 fO/fpw/8.26   BUN/Creatinine Ratio 14 12 - 28   Sodium 142 134 - 144 mmol/L   Potassium 5.0 3.5 - 5.2 mmol/L   Chloride 105 96 - 106 mmol/L   CO2 24 20 - 29 mmol/L   Calcium  9.5 8.7 - 10.3 mg/dL   Total Protein 6.8 6.0 - 8.5 g/dL   Albumin 4.3 3.9 - 4.9 g/dL   Globulin, Total 2.5 1.5 - 4.5 g/dL   Bilirubin Total 0.6 0.0 - 1.2 mg/dL   Alkaline Phosphatase 92 49 - 135 IU/L   AST 21 0 - 40 IU/L   ALT 25 0 - 32 IU/L  Urine Microalbumin w/creat. ratio  Result Value Ref Range   Creatinine, Urine 182.6 Not Estab. mg/dL   Microalbumin, Urine 88.3 Not Estab. ug/mL   Microalb/Creat Ratio 6 0 - 29 mg/g creat  HIV Antibody (routine testing w rflx)  Result Value Ref Range   HIV Screen 4th Generation wRfx Non Reactive Non Reactive  Hepatitis C antibody  Result Value Ref Range   Hep C Virus Ab Non Reactive Non Reactive  TSH  Result Value Ref Range   TSH 1.330 0.450 - 4.500 uIU/mL   Last CBC Lab Results  Component Value Date   WBC 7.6 03/13/2017   HGB 13.6 03/13/2017   HCT 40.3 03/13/2017   MCV 90 03/13/2017   MCH 30.4 03/13/2017   RDW 13.6 03/13/2017   PLT 221 03/13/2017   Last metabolic panel Lab Results  Component Value Date   GLUCOSE 114 (H) 07/19/2024   NA 142 07/19/2024   K 5.0 07/19/2024   CL 105 07/19/2024   CO2 24 07/19/2024   BUN 12 07/19/2024   CREATININE 0.85 07/19/2024   EGFR 77 07/19/2024   CALCIUM  9.5 07/19/2024   PROT 6.8 07/19/2024   ALBUMIN 4.3 07/19/2024   LABGLOB 2.5 07/19/2024   AGRATIO 1.6 03/13/2017  BILITOT 0.6 07/19/2024    ALKPHOS 92 07/19/2024   AST 21 07/19/2024   ALT 25 07/19/2024   ANIONGAP 10 07/08/2012   Last lipids Lab Results  Component Value Date   CHOL 201 (H) 06/12/2024   HDL 51 06/12/2024   LDLCALC 133 (H) 06/12/2024   TRIG 92 06/12/2024   CHOLHDL 3.9 06/12/2024   Last hemoglobin A1c Lab Results  Component Value Date   HGBA1C 6.8 (H) 06/12/2024   Last thyroid functions Lab Results  Component Value Date   TSH 1.330 07/19/2024        Assessment & Plan:    Routine Health Maintenance and Physical Exam  Health Maintenance  Topic Date Due   Eye exam for diabetics  Never done   Pneumococcal Vaccine for age over 54 (1 of 2 - PCV) Never done   Breast Cancer Screening  Never done   Zoster (Shingles) Vaccine (1 of 2) Never done   COVID-19 Vaccine (4 - 2025-26 season) 08/04/2024*   Flu Shot  01/28/2025*   Colon Cancer Screening  07/19/2025*   Hemoglobin A1C  12/13/2024   Yearly kidney function blood test for diabetes  07/19/2025   Yearly kidney health urinalysis for diabetes  07/19/2025   Complete foot exam   07/19/2025   DTaP/Tdap/Td vaccine (2 - Td or Tdap) 04/10/2029   Pap with HPV screening  06/27/2029   Hepatitis C Screening  Completed   HIV Screening  Completed   Hepatitis B Vaccine  Aged Out   HPV Vaccine  Aged Out   Meningitis B Vaccine  Aged Out  *Topic was postponed. The date shown is not the original due date.    Discussed health benefits of physical activity, and encouraged her to engage in regular exercise appropriate for her age and condition.  Annual physical exam Assessment & Plan: Labs ordered today.  Mammogram ordered Pap 06/27/2024 HPV- NILM  Declined colonscopy Declined flu vaccine  Orders: -     3D Screening Mammogram, Left and Right -     Comprehensive metabolic panel with GFR -     HIV Antibody (routine testing w rflx) -     Hepatitis C antibody -     TSH  Leg edema Assessment & Plan: Using once a week   Type 2 diabetes mellitus with  diabetic neuropathy, without long-term current use of insulin (HCC) Assessment & Plan: Has been checking fingersticks on average 130-140. Start metformin  500 mg twice daily.    Orders: -     Microalbumin / creatinine urine ratio  Positive colorectal cancer screening using Cologuard test Assessment & Plan: Discussed this at length, denies constipations, change in stool caliber, melena, or hematochezia. Declined coloscopy    Class 2 severe obesity due to excess calories with serious comorbidity and body mass index (BMI) of 35.0 to 35.9 in adult [facility]) Assessment & Plan: Weight is 208, recommended regular physical activity, 150 of moderate intensity exercise a week and weight bearing exercise. Continue healthy diet.    Mixed hyperlipidemia Assessment & Plan: Reports rosuvastatin  made palpitations more noticeable, restarted pravastatin  and has not had further palpations. Continue pravastatin  20 mg daily.    Other orders -     Pravastatin  Sodium; Take 1 tablet (20 mg total) by mouth daily.  Dispense: 90 tablet; Refill: 1 -     metFORMIN  HCl ER; Take 1 tablet (500 mg total) by mouth 2 (two) times daily with a meal.  Dispense: 180 tablet; Refill: 1    Return  in about 2 months (around 09/18/2024) for DM.     Harlene Saddler, MD

## 2024-07-19 NOTE — Patient Instructions (Signed)
 Please call to schedule your mammogram [phone]

## 2024-07-19 NOTE — Assessment & Plan Note (Addendum)
 Using once a week furosemide 20 mg once a week. No significant LE edema today.

## 2024-07-20 LAB — COMPREHENSIVE METABOLIC PANEL WITH GFR
ALT: 25 IU/L (ref 0–32)
AST: 21 IU/L (ref 0–40)
Albumin: 4.3 g/dL (ref 3.9–4.9)
Alkaline Phosphatase: 92 IU/L (ref 49–135)
BUN/Creatinine Ratio: 14 (ref 12–28)
BUN: 12 mg/dL (ref 8–27)
Bilirubin Total: 0.6 mg/dL (ref 0.0–1.2)
CO2: 24 mmol/L (ref 20–29)
Calcium: 9.5 mg/dL (ref 8.7–10.3)
Chloride: 105 mmol/L (ref 96–106)
Creatinine, Ser: 0.85 mg/dL (ref 0.57–1.00)
Globulin, Total: 2.5 g/dL (ref 1.5–4.5)
Glucose: 114 mg/dL — ABNORMAL HIGH (ref 70–99)
Potassium: 5 mmol/L (ref 3.5–5.2)
Sodium: 142 mmol/L (ref 134–144)
Total Protein: 6.8 g/dL (ref 6.0–8.5)
eGFR: 77 mL/min/1.73 (ref >59)

## 2024-07-20 LAB — HIV ANTIBODY (ROUTINE TESTING W REFLEX): HIV Screen 4th Generation wRfx: NONREACTIVE

## 2024-07-20 LAB — HEPATITIS C ANTIBODY: Hep C Virus Ab: NONREACTIVE

## 2024-07-20 LAB — MICROALBUMIN / CREATININE URINE RATIO
Creatinine, Urine: 182.6 mg/dL
Microalb/Creat Ratio: 6 mg/g{creat} (ref 0–29)
Microalbumin, Urine: 11.6 ug/mL

## 2024-07-20 LAB — TSH: TSH: 1.33 u[IU]/mL (ref 0.450–4.500)

## 2024-07-22 ENCOUNTER — Ambulatory Visit: Payer: Self-pay | Admitting: Student

## 2024-07-22 DIAGNOSIS — E1140 Type 2 diabetes mellitus with diabetic neuropathy, unspecified: Secondary | ICD-10-CM

## 2024-07-22 DIAGNOSIS — Z0000 Encounter for general adult medical examination without abnormal findings: Secondary | ICD-10-CM | POA: Insufficient documentation

## 2024-07-22 NOTE — Assessment & Plan Note (Signed)
 Weight is 208, recommended regular physical activity, 150 of moderate intensity exercise a week and weight bearing exercise. Continue healthy diet.

## 2024-07-22 NOTE — Telephone Encounter (Signed)
 Copied from CRM [id number]. Topic: Clinical - Lab/Test Results >> Jul 22, 2024 11:22 AM Kevelyn M wrote: Reason for CRM: Patient is calling in because she's not sure if she has Diabetes type I or II. Please advise. Call back #[phone]

## 2024-07-22 NOTE — Assessment & Plan Note (Signed)
 Discussed this at length, denies constipations, change in stool caliber, melena, or hematochezia. Declined coloscopy

## 2024-07-22 NOTE — Assessment & Plan Note (Addendum)
 Labs ordered today.  Mammogram ordered Pap 06/27/2024 HPV- NILM  Declined colonscopy Declined flu vaccine

## 2024-07-22 NOTE — Assessment & Plan Note (Signed)
 Reports rosuvastatin  made palpitations more noticeable, restarted pravastatin  and has not had further palpations. Continue pravastatin  20 mg daily.

## 2024-07-23 ENCOUNTER — Telehealth: Payer: Self-pay

## 2024-07-23 ENCOUNTER — Other Ambulatory Visit (HOSPITAL_COMMUNITY): Payer: Self-pay

## 2024-07-23 ENCOUNTER — Telehealth: Payer: Self-pay | Admitting: Pharmacy Technician

## 2024-07-23 MED ORDER — DEXCOM G7 SENSOR MISC
1.0000 | 3 refills | Status: AC
Start: 2024-07-23 — End: ?

## 2024-07-23 NOTE — Telephone Encounter (Signed)
 Pharmacy Patient Advocate Encounter  Received notification from [facility] that Prior Authorization for Dexcom G7 Sensor  has been DENIED.  No reason given; No denial letter received via Fax or CMM. It has been requested and will be uploaded to the media tab once received.     PA #/Case ID/Reference #: [id number]

## 2024-07-23 NOTE — Telephone Encounter (Signed)
 You're welcome dear!

## 2024-07-23 NOTE — Telephone Encounter (Signed)
 Pharmacy Patient Advocate Encounter   Received notification from Pt Calls Messages that prior authorization for Dexcom G7 Sensor  is required/requested.   Insurance verification completed.   The patient is insured through [facility] .   Per test claim: PA required; PA submitted to above mentioned insurance via Latent Key/confirmation #/EOC [id number] Status is pending

## 2024-07-23 NOTE — Telephone Encounter (Signed)
 Good morning below is the criteria for covered CMG from her plan, I did still submit the PA and the results will be in new telephone encounter 07/23/24 as soon as it's available. Have a great day!

## 2024-07-23 NOTE — Telephone Encounter (Signed)
 Thanks for the update

## 2024-07-25 NOTE — Telephone Encounter (Signed)
 Patient has been made aware of denial of PA. Patient verbalized understanding.

## 2024-08-22 ENCOUNTER — Ambulatory Visit

## 2024-08-22 DIAGNOSIS — L820 Inflamed seborrheic keratosis: Secondary | ICD-10-CM | POA: Diagnosis not present

## 2024-08-22 DIAGNOSIS — D1801 Hemangioma of skin and subcutaneous tissue: Secondary | ICD-10-CM

## 2024-08-22 DIAGNOSIS — D229 Melanocytic nevi, unspecified: Secondary | ICD-10-CM | POA: Diagnosis not present

## 2024-08-22 DIAGNOSIS — L814 Other melanin hyperpigmentation: Secondary | ICD-10-CM

## 2024-08-22 DIAGNOSIS — L680 Hirsutism: Secondary | ICD-10-CM

## 2024-08-22 DIAGNOSIS — W908XXA Exposure to other nonionizing radiation, initial encounter: Secondary | ICD-10-CM

## 2024-08-22 DIAGNOSIS — L578 Other skin changes due to chronic exposure to nonionizing radiation: Secondary | ICD-10-CM

## 2024-08-22 DIAGNOSIS — L821 Other seborrheic keratosis: Secondary | ICD-10-CM

## 2024-08-22 MED ORDER — EFLORNITHINE HCL 13.9 % EX CREA: TOPICAL_CREAM | CUTANEOUS | 1 refills | Status: AC

## 2024-08-22 NOTE — Progress Notes (Signed)
 Subjective   Olivia Bass is a 63 y.o. female who presents for the following: Lesion(s) of concern . Patient is new patient  Today patient reports: Area of concern on the left upper extremity Areas of concern on the chest Areas of concern on the back Patient reports excessive body hair  Review of Systems:    No other skin or systemic complaints except as noted in HPI or Assessment and Plan.  The following portions of the chart were reviewed this encounter and updated as appropriate: medications, allergies, medical history  Relevant Medical History:  n/a   Objective  Well appearing patient in no apparent distress; mood and affect are within normal limits. Examination was performed of the: Sun Exposed Exam: Scalp, head, eyes, ears, nose, lips, neck, upper extremities, hands, fingers, fingernails  Examination notable for: Angioma(s): Scattered red vascular papule(s)  , Lentigo/lentigines: Scattered pigmented macules that are tan to brown in color and are somewhat non-uniform in shape and concentrated in the sun-exposed areas, Nevus/nevi: Scattered well-demarcated, regular, pigmented macule(s) and/or papule(s)  , Seborrheic Keratosis(es): Stuck-on appearing keratotic papule(s) on the trunk, some  irritated with redness, crusting, edema, and/or partial avulsion, Actinic Damage/Elastosis: chronic sun damage: dyspigmentation, telangiectasia, and wrinkling  Examination limited by: Clothing and Patient deferred removal     Left Forearm, chest, neck, inframammary, back (12) Stuck on waxy paps with erythema  Assessment & Plan    BENIGN SKIN FINDINGS  - Lentigines  - Seborrheic keratoses  - Hemangiomas   - Nevus/Multiple Benign Nevi  - Reassurance provided regarding the benign appearance of lesions noted on exam today; no treatment is indicated in the absence of symptoms/changes. - Reinforced importance of photoprotective strategies including liberal and frequent sunscreen use of a  broad-spectrum SPF 30 or greater, use of protective clothing, and sun avoidance for prevention of cutaneous malignancy and photoaging.  Counseled patient on the importance of regular self-skin monitoring as well as routine clinical skin examinations as scheduled.   ACTINIC DAMAGE - Chronic condition, secondary to cumulative UV/sun exposure - Recommend daily broad spectrum sunscreen SPF 30+ to sun-exposed areas, reapply every 2 hours as needed.  - Staying in the shade or wearing long sleeves, sun glasses (UVA+UVB protection) and wide brim hats (4-inch brim around the entire circumference of the hat) are also recommended for sun protection.  - Call for new or changing lesions.  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 Condition is symptomatic/ bothersome to patient. Not currently at goal. Counseled patient on possible underlying etiologies of her hair growth, primarily related to ovarian, adrenal, or familial causes Patient is already on metformin  500mg  daily due to clinical diagnosis of diabetes mellitus  Counseled that other means of hair removal involve mostly cosmetic means - eg, Vaniqa (eflornithine cream - only FDA-approved medication for hirsutism), chemical depilatories (eg, Arnette or products that contain calcium  thioglycolate), or laser hair removal Explained that white fine hairs are less likely to respond to laser treatment. Laser and electrolysis are not permanent, may need to repeat treatment. Advised that all are considered cosmetic and would likely not be covered by insurance Patient expresses understanding of the treatment methods for hair growth as proposed above and their various risks and limitations - patient opts for   - Start eflornithine 13.% cream twice daily. Edu: Local irritation, acne formation and pseudofolliculitis barbae. Results expected in 1-2 months. If  local irritation  may decrease to once daily. Explained it will be an out of pocket expense.   Level of service outlined above   Procedures, orders, diagnosis for this visit:  INFLAMED SEBORRHEIC KERATOSIS (12) Left Forearm, chest, neck, inframammary, back (12) Symptomatic, irritating, patient would like treated. Destruction of lesion - Left Forearm, chest, neck, inframammary, back (12) Complexity: simple   Destruction method: cryotherapy   Informed consent: discussed and consent obtained   Timeout:  patient name, date of birth, surgical site, and procedure verified Lesion destroyed using liquid nitrogen: Yes   Region frozen until ice ball extended beyond lesion: Yes   Cryo cycles: 1 or 2. Outcome: patient tolerated procedure well with no complications   Post-procedure details: wound care instructions given     Inflamed seborrheic keratosis -     Destruction of lesion  Other orders -     Eflornithine HCl; Apply once daily to affected areas  Dispense: 45 g; Refill: 1    Return to clinic: Return if symptoms worsen or fail to improve.  I, Emerick Ege, CMA am acting as scribe for Lauraine JAYSON Kanaris, MD   Documentation: I have reviewed the above documentation for accuracy and completeness, and I agree with the above.  Lauraine JAYSON Kanaris, MD

## 2024-08-22 NOTE — Patient Instructions (Signed)

## 2024-08-26 ENCOUNTER — Telehealth: Payer: Self-pay

## 2024-08-26 NOTE — Telephone Encounter (Signed)
 [facility] pharmacy states that Eflornithine HCl 13.9% cream has been discontinued. Please advise.

## 2024-08-26 NOTE — Telephone Encounter (Signed)
 Patient defers sending in prescription to another pharmacy and will not worry about filling prescription at this time.

## 2024-09-13 ENCOUNTER — Ambulatory Visit: Payer: Self-pay

## 2024-09-13 NOTE — Telephone Encounter (Signed)
  FYI Only or Action Required?: FYI only for provider: no appointments proceeding to urgent care .  Patient was last seen in primary care on 07/19/2024 by Lemon Raisin, MD.  Called Nurse Triage reporting Ear Fullness.  Symptoms began several days ago.  Interventions attempted: Rest, hydration, or home remedies.  Symptoms are: gradually worsening.  Triage Disposition: See Physician Within 24 Hours  Patient/caregiver understands and will follow disposition?: Yes   Copied from CRM #[id number]. Topic: Clinical - Red Word Triage >> Sep 13, 2024  3:41 PM Avram MATSU wrote: Red Word that prompted transfer to Nurse Triage: blood sugar has been high close to 200. Right ear has been feeling full and neck pain in the back. Reason for Disposition  Earache lasts > 1 hour  Answer Assessment - Initial Assessment Questions 1. LOCATION: Which ear is involved?       Right ear  2. SENSATION: Describe how the ear feels. (e.g., stuffy, full, plugged).      Feels full. Bony process behind ear is inflamed and tender to touch.  3. ONSET:  When did the ear symptoms start?       Few days  4. PAIN: Do you also have an earache? If Yes, ask: How bad is it? (Scale 0-10; none, mild, moderate or severe)     Discomfort moderate 5. CAUSE: What do you think is causing the ear congestion? (e.g., common cold, nasal allergies, recent flight, recent snorkeling)     Unsure-she had recent covid vaccine  6. OTHER SYMPTOMS: Do you have any other symptoms? (e.g., ear drainage, hay fever symptoms such as sneezing or a clear nasal discharge; cold symptoms such as a cough or runny nose)     Blood sugar 166 on metformin . Right sided sinus pain and pressure.  7. PREGNANCY: Is there any chance you are pregnant? When was your last menstrual period?  Protocols used: Ear - Congestion-A-AH

## 2024-09-23 ENCOUNTER — Ambulatory Visit: Admitting: Student

## 2024-10-09 ENCOUNTER — Ambulatory Visit

## 2024-10-23 DIAGNOSIS — E782 Mixed hyperlipidemia: Secondary | ICD-10-CM | POA: Diagnosis not present

## 2024-10-23 DIAGNOSIS — R0789 Other chest pain: Secondary | ICD-10-CM | POA: Diagnosis not present

## 2024-10-23 DIAGNOSIS — R002 Palpitations: Secondary | ICD-10-CM | POA: Diagnosis not present
